# Patient Record
Sex: Female | Born: 1960 | Hispanic: Yes | Marital: Married | Smoking: Never smoker
Health system: Southern US, Community
[De-identification: ages and names within clinical notes are randomized; demographics above are authoritative.]

## PROBLEM LIST (undated history)

## (undated) DIAGNOSIS — G4730 Sleep apnea, unspecified: Secondary | ICD-10-CM

## (undated) DIAGNOSIS — N730 Acute parametritis and pelvic cellulitis: Secondary | ICD-10-CM

## (undated) DIAGNOSIS — F419 Anxiety disorder, unspecified: Secondary | ICD-10-CM

## (undated) DIAGNOSIS — I4891 Unspecified atrial fibrillation: Secondary | ICD-10-CM

## (undated) DIAGNOSIS — I10 Essential (primary) hypertension: Secondary | ICD-10-CM

## (undated) DIAGNOSIS — M1990 Unspecified osteoarthritis, unspecified site: Secondary | ICD-10-CM

## (undated) DIAGNOSIS — M797 Fibromyalgia: Secondary | ICD-10-CM

## (undated) DIAGNOSIS — E119 Type 2 diabetes mellitus without complications: Secondary | ICD-10-CM

## (undated) DIAGNOSIS — J45909 Unspecified asthma, uncomplicated: Secondary | ICD-10-CM

## (undated) DIAGNOSIS — F99 Mental disorder, not otherwise specified: Secondary | ICD-10-CM

## (undated) DIAGNOSIS — F32A Depression, unspecified: Secondary | ICD-10-CM

## (undated) DIAGNOSIS — E079 Disorder of thyroid, unspecified: Secondary | ICD-10-CM

## (undated) HISTORY — DX: Mental disorder, not otherwise specified: F99

## (undated) HISTORY — DX: Unspecified osteoarthritis, unspecified site: M19.90

## (undated) HISTORY — DX: Fibromyalgia: M79.7

## (undated) HISTORY — DX: Unspecified atrial fibrillation: I48.91

## (undated) HISTORY — DX: Disorder of thyroid, unspecified: E07.9

## (undated) HISTORY — PX: APPENDECTOMY: SHX54

## (undated) HISTORY — PX: THYROID LOBECTOMY: SHX420

## (undated) HISTORY — PX: TUBAL LIGATION: SHX77

## (undated) HISTORY — DX: Acute parametritis and pelvic cellulitis: N73.0

---

## 2011-07-31 DIAGNOSIS — G4733 Obstructive sleep apnea (adult) (pediatric): Secondary | ICD-10-CM | POA: Diagnosis present

## 2012-02-07 DIAGNOSIS — M797 Fibromyalgia: Secondary | ICD-10-CM | POA: Diagnosis present

## 2015-12-19 DIAGNOSIS — F32A Depression, unspecified: Secondary | ICD-10-CM | POA: Diagnosis present

## 2016-11-15 DIAGNOSIS — E890 Postprocedural hypothyroidism: Secondary | ICD-10-CM | POA: Diagnosis present

## 2020-03-21 DIAGNOSIS — J4520 Mild intermittent asthma, uncomplicated: Secondary | ICD-10-CM | POA: Insufficient documentation

## 2020-03-21 DIAGNOSIS — I10 Essential (primary) hypertension: Secondary | ICD-10-CM | POA: Diagnosis present

## 2020-04-01 ENCOUNTER — Emergency Department (HOSPITAL_COMMUNITY)
Admission: EM | Admit: 2020-04-01 | Discharge: 2020-04-01 | Disposition: A | Discharge status: Home / Self Care | Payer: Medicare HMO | Attending: Emergency Medicine | Admitting: Emergency Medicine

## 2020-04-01 ENCOUNTER — Other Ambulatory Visit: Payer: Self-pay

## 2020-04-01 DIAGNOSIS — Z5321 Procedure and treatment not carried out due to patient leaving prior to being seen by health care provider: Secondary | ICD-10-CM | POA: Insufficient documentation

## 2020-04-01 DIAGNOSIS — M62830 Muscle spasm of back: Secondary | ICD-10-CM | POA: Diagnosis not present

## 2020-04-01 DIAGNOSIS — M545 Low back pain, unspecified: Secondary | ICD-10-CM | POA: Diagnosis not present

## 2020-04-01 NOTE — ED Triage Notes (Signed)
Pt with hx of back spasms here for same, lasting since yesterday afternoon. Pt ambulatory but in obvious pain.

## 2020-04-01 NOTE — ED Notes (Signed)
Pt stated that she will not be able to wait any longer and will be leaving. Pt encouraged to stay, pt left anyways.

## 2020-06-06 ENCOUNTER — Other Ambulatory Visit: Payer: Self-pay

## 2020-06-06 ENCOUNTER — Ambulatory Visit (INDEPENDENT_AMBULATORY_CARE_PROVIDER_SITE_OTHER): Payer: Medicare HMO | Admitting: Obstetrics and Gynecology

## 2020-06-06 ENCOUNTER — Encounter: Payer: Self-pay | Admitting: Obstetrics and Gynecology

## 2020-06-06 VITALS — BP 145/83 | HR 74 | Ht 63.0 in | Wt 239.0 lb

## 2020-06-06 DIAGNOSIS — Z01419 Encounter for gynecological examination (general) (routine) without abnormal findings: Secondary | ICD-10-CM | POA: Diagnosis not present

## 2020-06-06 DIAGNOSIS — L988 Other specified disorders of the skin and subcutaneous tissue: Secondary | ICD-10-CM

## 2020-06-06 DIAGNOSIS — N649 Disorder of breast, unspecified: Secondary | ICD-10-CM | POA: Diagnosis not present

## 2020-06-06 NOTE — Progress Notes (Signed)
Obstetrics and Gynecology New Patient Evaluation  Appointment Date: 06/06/2020  OBGYN Clinic: [facility]  Primary Care Provider: Dr. Su Hilt  Referring Provider: self  Chief Complaint:  Chief Complaint  Patient presents with  . Gynecologic Exam    History of Present Illness: Allison Potts is a 60 y.o. [id number] (LMP: 60 y/o), seen for the above chief complaint.  Patient made annual exam for a regular GYN appointment. She was told by her PCP that she had "pus in her urine" and that she needed to see GYN. She states they wanted to treat her with abx but she didn't think it was necessary because she's had bacteria in her urine in the past and she is currently asymptomatic.    Review of Systems: Pertinent items noted in HPI and remainder of comprehensive ROS otherwise negative.   Past Medical History:  Past Medical History:  Diagnosis Date  . Arthritis   . Atrial fibrillation (HCC)   . Fibromyalgia   . Mental disorder   . PID (acute pelvic inflammatory disease)    her 55s  . 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 APPENDECTOMY    . THYROID LOBECTOMY    . TUBAL LIGATION      Past Obstetrical History:  OB History  Gravida Para Term Preterm AB Living  6 4 4   2 4   SAB IAB Ectopic Multiple Live Births  1 1     4     # Outcome Date GA Lbr Len/2nd Weight Sex Delivery Anes PTL Lv  6 Term           5 Term           4 Term           3 Term           2 IAB           1 SAB             Obstetric Comments  Vaginal delivery x 4    Past Gynecological History: As per HPI. History of Pap Smear(s): Yes.   Last pap 2020, which was negative History of HRT use: No.  Social History:  Social History   Socioeconomic History  . Marital status: Single    Spouse name: Not on file  . Number of children: Not on file  . Years of education: Not on file  .  Highest education level: Not on file  Occupational History  . Not on file  Tobacco Use  . Smoking status: Never Smoker  . Smokeless tobacco: Never Used  Vaping Use  . Vaping Use: Never used  Substance and Sexual Activity  . Alcohol use: Not Currently  . Drug use: Not Currently  . Sexual activity: Yes    Birth control/protection: Surgical  Other Topics Concern  . Not on file  Social History Narrative  . Not on file   Social Determinants of Health   Financial Resource Strain: Not on file  Food Insecurity: Not on file  Transportation Needs: Not on file  Physical Activity: Not on file  Stress: Not on file  Social Connections: Not on file  Intimate Partner Violence: Not on file    Family History:  Niece on her mom's side dx with breast cancer in her early 50s  Health Maintenance:  Mammogram(s): Yes.   Date: approx 10 years  ago  Medications None  Allergies Penicillins   Physical Exam:  BP (!) 145/83   Pulse 74   Ht 5\' 3"  (1.6 m)   Wt 239 lb (108.4 kg)   BMI 42.34 kg/m  Body mass index is 42.34 kg/m. General appearance: Well nourished, well developed female in no acute distress.  Neck:  Supple, normal appearance, and no thyromegaly  Cardiovascular: normal s1 and s2.  No murmurs, rubs or gallops. Respiratory:  Clear to auscultation bilateral. Normal respiratory effort Abdomen: positive bowel sounds and no masses, hernias; diffusely non tender to palpation, non distended Breasts: breasts appear normal, no suspicious masses, no skin or nipple changes or axillary nodes, except for a half cm spot at the 8 o'clock position on the areola, slightly raised, nttp, brown with slightly irregular borders and normal palpation. Pt states that with palpation her breasts are diffusely ttp Neuro/Psych:  Normal mood and affect.  Skin:  Warm and dry.  Lymphatic:  No inguinal lymphadenopathy.   Pelvic exam: is not limited by body habitus EGBUS: within normal limits, moderate  atrophy Vagina: within normal limits and with no blood or discharge in the vault, moderate atrophy Cervix: normal appearing cervix without tenderness, discharge or lesions. Uterus:  nonenlarged and non tender Adnexa:  normal adnexa and no mass, fullness, tenderness Rectovaginal: deferred  Laboratory: none  Radiology: none  Assessment: pt stable  Plan: 1. Well woman exam with routine gynecological exam Routine care. Patient declines mammogram. Prior GYN records from [place] reviewed and nothing remarkable. I told her that I don't have the results from the urine culture but usually asymptomatic bacteruria is not treated but to follow up with her PCP - Ambulatory referral to Dermatology  2. Skin lesion of breast Pt amenable to derm referral  - Ambulatory referral to Dermatology  Orders Placed This Encounter  Procedures  . Ambulatory referral to Dermatology    RTC PRN  , McLeod Bing MD Attending [facility])

## 2020-06-06 NOTE — Progress Notes (Signed)
Last pap 2020-WNL  Declines mammo  referred by PCP for bacteria in urine

## 2020-09-19 ENCOUNTER — Encounter: Payer: Self-pay | Admitting: Plastic Surgery

## 2020-09-19 ENCOUNTER — Ambulatory Visit (INDEPENDENT_AMBULATORY_CARE_PROVIDER_SITE_OTHER): Payer: Medicare HMO | Admitting: Plastic Surgery

## 2020-09-19 ENCOUNTER — Other Ambulatory Visit: Payer: Self-pay

## 2020-09-19 VITALS — BP 154/88 | HR 67 | Ht 63.0 in | Wt 229.0 lb

## 2020-09-19 DIAGNOSIS — M793 Panniculitis, unspecified: Secondary | ICD-10-CM

## 2020-09-19 NOTE — Progress Notes (Signed)
Referring Provider No referring provider defined for this encounter.   CC:  Chief Complaint  Patient presents with  . Advice Only      Allison Potts is an 60 y.o. female.  HPI: Patient presents to discuss her abdomen.  She is bothered by the overhanging skin.  She did lose about 30 pounds through diet and exercise and her weight has been stable for several years.  She gets rashes beneath the skin that been refractory to over-the-counter and prescription treatments.  These were treated at her previous doctors up in [place] and did not seem to help much.  Its worse with worse in the summertime.  Prior abdominal surgeries include appendectomy and tubal ligation.  She's pre-diabetic and does not smoke.  Allergies  Allergen Reactions  . Penicillins Hives    Passing out, lumps and bumps, was given epi    Outpatient Encounter Medications as of 09/19/2020  Medication Sig  . amphetamine-dextroamphetamine (ADDERALL) 10 MG tablet Take 10 mg by mouth 2 (two) times daily.  Marland Kitchen buPROPion (WELLBUTRIN SR) 150 MG 12 hr tablet Take 150 mg by mouth 2 (two) times daily.  Marland Kitchen gabapentin (NEURONTIN) 100 MG capsule Take 1 capsule by mouth 2 (two) times daily.  Marland Kitchen losartan (COZAAR) 50 MG tablet Take 1 tablet by mouth daily.  Marland Kitchen OVER THE COUNTER MEDICATION Vitamin C-Take 1 tablet daily.  Marland Kitchen OVER THE COUNTER MEDICATION Collagen-Take 1 daily.  Marland Kitchen OVER THE COUNTER MEDICATION MVI-Take 1 by mouth daily.   No facility-administered encounter medications on file as of 09/19/2020.     Past Medical History:  Diagnosis Date  . Arthritis   . Atrial fibrillation (HCC)   . Fibromyalgia   . Mental disorder   . PID (acute pelvic inflammatory disease)    her 5s  . 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 APPENDECTOMY    . THYROID LOBECTOMY    . TUBAL LIGATION      No family history on file.  Social History   Social History Narrative   . Not on file     Review of Systems General: Denies fevers, chills, weight loss CV: Denies chest pain, shortness of breath, palpitations  Physical Exam Vitals with BMI 09/19/2020 06/06/2020 04/01/2020  Height 5\' 3"  5\' 3"  -  Weight 229 lbs 239 lbs -  BMI 40.58 42.35 -  Systolic 154 145  Diastolic 88 83 72  Pulse 67 74 72    General:  No acute distress,  Alert and oriented, Non-Toxic, Normal speech and affect Abdomen: Abdomen is soft and nontender.  I do not appreciate any hernias.  She has an overhanging infraumbilical pannus with clear signs of skin irritation in the crease.  She has a excessive suprapubic area which bothers her but she pointed out to me.  No obvious scars in the upper abdomen  Assessment/Plan Patient presents with panniculitis.  We discussed infraumbilical panniculectomy with the possible addition of liposuction and plication if she were to want those additional procedures.  I discussed the infraumbilical panniculectomy would not address the upper abdomen and would partially address the suprapubic area with some debulking.  We discussed the risk that include bleeding, infection, damage to surrounding structures and need for additional procedures.  I discussed the expected postoperative course along with other risks that include potential persistent contour irregularities.  I discussed the location and orientation of the scar.  I discussed  the need for drains postoperatively.  We will plan to move forward with insurance approval and all of her questions were answered.  Allison Potts 09/19/2020, 11:46 AM

## 2020-10-04 ENCOUNTER — Telehealth: Payer: Self-pay | Admitting: Plastic Surgery

## 2020-10-04 NOTE — Telephone Encounter (Signed)
Returned patients call regarding cardia clearance. Patient was confused as to why she needed to see a cardiologist. I explained that with her history of A-fib, we prefer to have cardiac clearance for all elective procedures. She advised that her PCP could do an EKG for her and wanted to know if that was good enough. I advised that we cannot force her to see a cardiologist, but for cosmetic/elective surgeries, it is advantageous to be prepared for the facility/anesthesiologist to request cardiac clearance. Otherwise, we may run the risk of the surgery being postponed until cardiac clearance is obtained. Patient voiced understanding and agreement, and plans to see the cardiologist this week. She will see her PCP next week and we will await the surgery clearance from both doctors.

## 2020-10-05 ENCOUNTER — Encounter: Payer: Self-pay | Admitting: Cardiology

## 2020-10-05 ENCOUNTER — Ambulatory Visit: Payer: Medicare HMO | Admitting: Cardiology

## 2020-10-05 ENCOUNTER — Other Ambulatory Visit: Payer: Self-pay

## 2020-10-05 VITALS — BP 134/72 | HR 74 | Ht 63.0 in | Wt 228.6 lb

## 2020-10-05 DIAGNOSIS — I4891 Unspecified atrial fibrillation: Secondary | ICD-10-CM | POA: Diagnosis not present

## 2020-10-05 DIAGNOSIS — G4733 Obstructive sleep apnea (adult) (pediatric): Secondary | ICD-10-CM

## 2020-10-05 DIAGNOSIS — I10 Essential (primary) hypertension: Secondary | ICD-10-CM | POA: Diagnosis not present

## 2020-10-05 DIAGNOSIS — Z01810 Encounter for preprocedural cardiovascular examination: Secondary | ICD-10-CM | POA: Diagnosis not present

## 2020-10-05 DIAGNOSIS — Z01818 Encounter for other preprocedural examination: Secondary | ICD-10-CM

## 2020-10-05 NOTE — Patient Instructions (Addendum)
Medication Instructions:  Your physician recommends that you continue on your current medications as directed. Please refer to the Current Medication list given to you today.  Testing/Procedures: Your physician has requested that you have an echocardiogram (prior to 6/28 if possible). Echocardiography is a painless test that uses sound waves to create images of your heart. It provides your doctor with information about the size and shape of your heart and how well your heart's chambers and valves are working. This procedure takes approximately one hour. There are no restrictions for this procedure.  This will be done at our [facility] location:  [address]  Follow-Up: At [facility], you and your health needs are our priority.  As part of our continuing mission to provide you with exceptional heart care, we have created designated Provider Care Teams.  These Care Teams include your primary Cardiologist (physician) and Advanced Practice Providers (APPs -  Physician Assistants and Nurse Practitioners) who all work together to provide you with the care you need, when you need it.  We recommend signing up for the patient portal called "MyChart".  Sign up information is provided on this After Visit Summary.  MyChart is used to connect with patients for Virtual Visits (Telemedicine).  Patients are able to view lab/test results, encounter notes, upcoming appointments, etc.  Non-urgent messages can be sent to your provider as well.   To learn more about what you can do with MyChart, go to [email or web address].    Your next appointment:   3 month(s)  The format for your next appointment:   In Person  Provider:   Epifanio Lesches, MD

## 2020-10-05 NOTE — Progress Notes (Signed)
Cardiology Office Note:    Date:  10/05/2020   ID:  Allison Potts, DOB [birth year], MRN [id number]  PCP:  Burton Apley, MD  Cardiologist:  None  Electrophysiologist:  None   Referring MD: [name], MD   Chief Complaint  Patient presents with  . Atrial Fibrillation         History of Present Illness:    Allison Potts is a 60 y.o. female with a hx of atrial fibrillation who is referred for preop evaluation prior to panniculectomy by Dr Arita Miss.  Reports was diagnosed with atrial fibrillation in June 2021 in [place].  Reports occurred after having intercourse with her husband, felt palpitations and called EMS.  Was told she was in Afib.  Went to ED and converted to sinus rhythm.  She saw Dr. Ronny Flurry in [place].  She was not started on anticoagulation.  She does not think she had an echocardiogram done.  She purchased a Kardia mobile device and has been following for recurrent atrial fibrillation, has not had any.  Has not had any palpitations since that time.  She has been exercising regularly over the last 2 weeks.  She does 5-minutes of walking and swims continuously for 10 to 15 minutes and does jumping jacks for a total workout time of 30 minutes.  She has lost 12 pounds in the last few months.  She denies any exertional chest pain or dyspnea.  Denies any lightheadedness or syncope.  Does report some mild lower extremity edema.  Reports she smoked from age 44 to 76, up to 3 packs/day.  Family history includes father had CABG, she thinks in his 27s.   Past Medical History:  Diagnosis Date  . Arthritis   . Atrial fibrillation (HCC)   . Fibromyalgia   . Mental disorder   . PID (acute pelvic inflammatory disease)    her 71s  . 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 APPENDECTOMY    . THYROID LOBECTOMY    . TUBAL LIGATION      Current Medications: Current Meds  Medication Sig  .  amitriptyline (ELAVIL) 25 MG tablet amitriptyline 25 mg tablet  TK 1 T PO HS  . amphetamine-dextroamphetamine (ADDERALL) 10 MG tablet Take 10 mg by mouth 2 (two) times daily.  Marland Kitchen buPROPion (WELLBUTRIN SR) 150 MG 12 hr tablet Take 150 mg by mouth 2 (two) times daily.  Marland Kitchen gabapentin (NEURONTIN) 100 MG capsule Take 1 capsule by mouth 2 (two) times daily.  . hydrOXYzine (ATARAX/VISTARIL) 50 MG tablet hydroxyzine HCl 50 mg tablet  . losartan (COZAAR) 50 MG tablet Take 1 tablet by mouth daily.  Marland Kitchen OVER THE COUNTER MEDICATION Vitamin C-Take 1 tablet daily.  Marland Kitchen OVER THE COUNTER MEDICATION Collagen-Take 1 daily.  Marland Kitchen OVER THE COUNTER MEDICATION MVI-Take 1 by mouth daily.     Allergies:   Penicillins   Social History   Socioeconomic History  . Marital status: Married    Spouse name: Not on file  . Number of children: Not on file  . Years of education: Not on file  . Highest education level: Not on file  Occupational History  . Not on file  Tobacco Use  . Smoking status: Never Smoker  . Smokeless tobacco: Never Used  Vaping Use  . Vaping Use: Never used  Substance and Sexual Activity  . Alcohol use: Not Currently  . Drug use: Not Currently  .  Sexual activity: Yes    Birth control/protection: Surgical  Other Topics Concern  . Not on file  Social History Narrative  . Not on file   Social Determinants of Health   Financial Resource Strain: Not on file  Food Insecurity: Not on file  Transportation Needs: Not on file  Physical Activity: Not on file  Stress: Not on file  Social Connections: Not on file     Family History: Family history includes father had CABG, she thinks in his 11s.  ROS:   Please see the history of present illness.    All other systems reviewed and are negative.  EKGs/Labs/Other Studies Reviewed:    The following studies were reviewed today:   EKG:  EKG is  ordered today.  The ekg ordered today demonstrates normal sinus rhythm, rate 74, poor R wave  progression  Recent Labs: No results found for requested labs within last 8760 hours.  Recent Lipid Panel No results found for: CHOL, TRIG, HDL, CHOLHDL, VLDL, LDLCALC, LDLDIRECT  Physical Exam:    VS:  BP 134/72   Pulse 74   Ht 5\' 3"  (1.6 m)   Wt 228 lb 9.6 oz (103.7 kg)   SpO2 95%   BMI 40.49 kg/m     Wt Readings from Last 3 Encounters:  10/05/20 228 lb 9.6 oz (103.7 kg)  09/19/20 229 lb (103.9 kg)  06/06/20 239 lb (108.4 kg)     GEN: Well nourished, well developed in no acute distress HEENT: Normal NECK: No JVD; No carotid bruits LYMPHATICS: No lymphadenopathy CARDIAC: RRR, no murmurs, rubs, gallops RESPIRATORY:  Clear to auscultation without rales, wheezing or rhonchi  ABDOMEN: Soft, non-tender, non-distended MUSCULOSKELETAL:  No edema; No deformity  SKIN: Warm and dry NEUROLOGIC:  Alert and oriented x 3 PSYCHIATRIC:  Normal affect   ASSESSMENT:    1. Atrial fibrillation, unspecified type (HCC)   2. Pre-op evaluation   3. Essential hypertension   4. OSA (obstructive sleep apnea)    PLAN:    Atrial fibrillation: Patient reports had a single episode of A. fib in June 2021, went to ED in 08-12-2000 and converted to sinus rhythm spontaneously.  CHA2DS2-VASc 2 (hypertension, female), she was not started on anticoagulation -We will obtain records from prior cardiologist, Dr. [place] in [facility] -Echocardiogram -Continue to monitor for recurrent A. fib on Kardia mobile device  Preop evaluation: Prior to panniculectomy.  No symptoms to suggest active cardiac condition.  Good functional capacity, greater than 4 METS without any exertional symptoms.  Planning echocardiogram as above due to history of A. fib, otherwise no cardiac work-up recommended prior to surgery.  Hypertension: on losartan 50 mg daily.  Appears controlled  OSA: has CPAP but has not been using since she moved to [facility]. Will refer to sleep medicine.  RTC in 3  months   Medication Adjustments/Labs and Tests Ordered: Current medicines are reviewed at length with the patient today.  Concerns regarding medicines are outlined above.  Orders Placed This Encounter  Procedures  . EKG 12-Lead  . ECHOCARDIOGRAM COMPLETE   No orders of the defined types were placed in this encounter.   Patient Instructions  Medication Instructions:  Your physician recommends that you continue on your current medications as directed. Please refer to the Current Medication list given to you today.  Testing/Procedures: Your physician has requested that you have an echocardiogram (prior to 6/28 if possible). Echocardiography is a painless test that uses sound waves to create images of your  heart. It provides your doctor with information about the size and shape of your heart and how well your heart's chambers and valves are working. This procedure takes approximately one hour. There are no restrictions for this procedure.  This will be done at our [facility] location:  [address]  Follow-Up: At [facility], you and your health needs are our priority.  As part of our continuing mission to provide you with exceptional heart care, we have created designated Provider Care Teams.  These Care Teams include your primary Cardiologist (physician) and Advanced Practice Providers (APPs -  Physician Assistants and Nurse Practitioners) who all work together to provide you with the care you need, when you need it.  We recommend signing up for the patient portal called "MyChart".  Sign up information is provided on this After Visit Summary.  MyChart is used to connect with patients for Virtual Visits (Telemedicine).  Patients are able to view lab/test results, encounter notes, upcoming appointments, etc.  Non-urgent messages can be sent to your provider as well.   To learn more about what you can do with MyChart, go to [email or web address].    Your next  appointment:   3 month(s)  The format for your next appointment:   In Person  Provider:   Epifanio Lesches, MD        Signed, Allison Ishikawa, MD  10/05/2020 3:08 PM    [facility]

## 2020-10-06 ENCOUNTER — Institutional Professional Consult (permissible substitution): Payer: Medicare HMO | Admitting: Plastic Surgery

## 2020-10-13 ENCOUNTER — Ambulatory Visit: Payer: Medicare HMO | Admitting: Plastic Surgery

## 2020-10-13 ENCOUNTER — Other Ambulatory Visit: Payer: Self-pay

## 2020-10-13 ENCOUNTER — Encounter: Payer: Self-pay | Admitting: Plastic Surgery

## 2020-10-13 VITALS — BP 156/84 | HR 83 | Ht 63.0 in | Wt 226.8 lb

## 2020-10-13 DIAGNOSIS — Z411 Encounter for cosmetic surgery: Secondary | ICD-10-CM

## 2020-10-13 DIAGNOSIS — Z719 Counseling, unspecified: Secondary | ICD-10-CM

## 2020-10-13 DIAGNOSIS — M793 Panniculitis, unspecified: Secondary | ICD-10-CM

## 2020-10-13 MED ORDER — ONDANSETRON HCL 4 MG PO TABS: 4.0000 mg | ORAL_TABLET | Freq: Three times a day (TID) | ORAL | 0 refills | Status: DC | PRN

## 2020-10-13 MED ORDER — HYDROCODONE-ACETAMINOPHEN 5-325 MG PO TABS: 1.0000 | ORAL_TABLET | ORAL | 0 refills | Status: DC | PRN

## 2020-10-13 NOTE — Progress Notes (Signed)
   Referring Provider Burton Apley, MD [address], [address] [place],  [place] [address]   CC:  Chief Complaint  Patient presents with   Pre-op Exam      Allison Potts is an 60 y.o. female.  HPI: Patient presents for preoperative appointment for her upcoming infraumbilical panniculectomy combined with upper abdominal liposuction and plication.  She has a few questions and concerns to go over.  Allergies  Allergen Reactions   Penicillins Hives    Passing out, lumps and bumps, was given epi    Outpatient Encounter Medications as of 10/13/2020  Medication Sig   amphetamine-dextroamphetamine (ADDERALL) 10 MG tablet Take 10 mg by mouth 2 (two) times daily.   buPROPion (WELLBUTRIN SR) 150 MG 12 hr tablet Take 150 mg by mouth 2 (two) times daily.   gabapentin (NEURONTIN) 100 MG capsule Take 1 capsule by mouth 2 (two) times daily.   losartan (COZAAR) 50 MG tablet Take 1 tablet by mouth daily.   OVER THE COUNTER MEDICATION Vitamin C-Take 1 tablet daily.   OVER THE COUNTER MEDICATION Collagen-Take 1 daily.   OVER THE COUNTER MEDICATION MVI-Take 1 by mouth daily.   [DISCONTINUED] amitriptyline (ELAVIL) 25 MG tablet amitriptyline 25 mg tablet  TK 1 T PO HS (Patient not taking: Reported on 10/13/2020)   [DISCONTINUED] hydrOXYzine (ATARAX/VISTARIL) 50 MG tablet hydroxyzine HCl 50 mg tablet (Patient not taking: Reported on 10/13/2020)   No facility-administered encounter medications on file as of 10/13/2020.     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  Social History   Social History Narrative   Not on file     Review of Systems General: Denies fevers,  chills, weight loss CV: Denies chest pain, shortness of breath, palpitations  Physical Exam Vitals with BMI 10/13/2020 10/05/2020 09/19/2020  Height 5\' 3"  5\' 3"  5\' 3"   Weight 226 lbs 13 oz 228 lbs 10 oz 229 lbs  BMI 40.19 40.5 40.58  Systolic 156 134  Diastolic 84 72 88  Pulse 83 74 67    General:  No acute distress,  Alert and oriented, Non-Toxic, Normal speech and affect Cardiovascular: Regular rhythm Pulmonary: Unlabored   Assessment/Plan Patient presents for preoperative appointment for infraumbilical panniculectomy combined with upper abdominal liposuction and plication.  We went over the risks of the procedure in detail that include bleeding, infection, damage to surrounding structures and need for additional procedure.  We discussed the details of the operation along with the location and orientation of the scars.  We discussed other postoperative complications including wound healing and persistent contour irregularities.  She went over our consent form in detail and signed it today.  She had an opportunity to ask any additional questions about the consent form and all of her questions were ultimately answered.  I will plan to send her in Norco and Zofran for her postoperative prescriptions and we will see her the day of her surgery.  10/13/2020, 5:01 PM

## 2020-10-19 ENCOUNTER — Other Ambulatory Visit: Payer: Self-pay

## 2020-10-19 ENCOUNTER — Telehealth: Payer: Self-pay

## 2020-10-19 ENCOUNTER — Ambulatory Visit (HOSPITAL_COMMUNITY)
Admission: RE | Admit: 2020-10-19 | Discharge: 2020-10-19 | Disposition: A | Discharge status: Home / Self Care | Payer: Medicare HMO | Source: Ambulatory Visit | Attending: Cardiology | Admitting: Cardiology

## 2020-10-19 DIAGNOSIS — I4891 Unspecified atrial fibrillation: Secondary | ICD-10-CM | POA: Diagnosis present

## 2020-10-19 DIAGNOSIS — I517 Cardiomegaly: Secondary | ICD-10-CM | POA: Diagnosis not present

## 2020-10-19 LAB — ECHOCARDIOGRAM COMPLETE
Area-P 1/2: 2.91 cm2
S' Lateral: 2.8 cm

## 2020-10-19 NOTE — Telephone Encounter (Signed)
Patient states she hurt her back after overdoing it swimming. She was prescribed Prednisone but has not taken any as she is managing the pain with Motrin, 400 mg 2-3 times daily. She would like to know if it is ok to continue taking Motrin this soon before surgery.

## 2020-10-19 NOTE — Progress Notes (Signed)
  Echocardiogram 2D Echocardiogram has been performed.  Allison Potts 10/19/2020, 9:12 AM

## 2020-10-19 NOTE — Telephone Encounter (Signed)
Returned patients call. Spoke with Dr. Arita Miss and he is okay to continue taking the motrin up until a couple of days prior to surgery. Patient understood and agreed with plan.

## 2020-10-20 ENCOUNTER — Encounter: Payer: Self-pay | Admitting: Plastic Surgery

## 2020-10-20 ENCOUNTER — Telehealth: Payer: Self-pay | Admitting: Plastic Surgery

## 2020-10-20 NOTE — Telephone Encounter (Signed)
Returned patients call. Advised her if the RN from [facility] approved taking the prednisone prior to surgery, she can take the medication. Patient understood and agreed with plan.

## 2020-10-20 NOTE — Telephone Encounter (Signed)
Patient stated she is in a lot of pain and wants to know if she can go ahead and take methylprednisolone 4mg  prior to her surgery. Per patient, RN at sugery site approved it. Please call to advise [phone]. Thanks.

## 2020-10-23 ENCOUNTER — Ambulatory Visit (HOSPITAL_COMMUNITY)
Admission: EM | Admit: 2020-10-23 | Discharge: 2020-10-23 | Disposition: A | Discharge status: Home / Self Care | Payer: Medicare HMO

## 2020-10-23 ENCOUNTER — Other Ambulatory Visit: Payer: Self-pay

## 2020-10-23 ENCOUNTER — Encounter (HOSPITAL_COMMUNITY): Payer: Self-pay

## 2020-10-23 DIAGNOSIS — M545 Low back pain, unspecified: Secondary | ICD-10-CM

## 2020-10-23 MED ORDER — BACITRACIN ZINC 500 UNIT/GM EX OINT
TOPICAL_OINTMENT | CUTANEOUS | Status: AC
  Filled 2020-10-23: qty 0.9

## 2020-10-23 MED ORDER — KETOROLAC TROMETHAMINE 60 MG/2ML IM SOLN
60.0000 mg | Freq: Once | INTRAMUSCULAR | Status: AC
  Administered 2020-10-23: 60 mg via INTRAMUSCULAR

## 2020-10-23 MED ORDER — KETOROLAC TROMETHAMINE 60 MG/2ML IM SOLN
INTRAMUSCULAR | Status: AC
  Filled 2020-10-23: qty 2

## 2020-10-23 NOTE — ED Provider Notes (Addendum)
[facility]    CSN: [id number] Arrival date & time: 10/23/20  1017      History   Chief Complaint Chief Complaint  Patient presents with   Back Pain    HPI Allison Potts is a 60 y.o. female.   HPI  Back Pain: Patient reports that she has had bilateral lower back pain for the past 7 days.  Acute on chronic in nature secondary to fibromyalgia and scoliosis per patient. No known injury.  She denies any urinary symptoms or abdominal pain.  She states that she has tried Medrol without any improvement. She states that normally when this occurs she needs a shot of Toradol which helps. Complicating history is an upcoming elective surgery on 10/25/2020. No saddle anesthesia, incontinence or neuro weakness.  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   Past Surgical History:  Procedure Laterality Date   APPENDECTOMY     THYROID LOBECTOMY     TUBAL LIGATION      OB History     Gravida  6   Para  4   Term  4   Preterm      AB  2   Living  4      SAB  1   IAB  1   Ectopic      Multiple      Live Births  4        Obstetric Comments  Vaginal delivery x 4          Home Medications    Prior to Admission medications   Medication Sig Start Date End Date Taking? Authorizing Provider  amphetamine-dextroamphetamine (ADDERALL) 10 MG tablet Take 10 mg by mouth 2 (two) times daily. 08/16/20   [provider]  buPROPion (WELLBUTRIN SR) 150 MG 12 hr tablet Take 150 mg by mouth 2 (two) times daily. 07/07/20   [provider]  carisoprodol (SOMA) 250 MG tablet TAKE 1 TABLET TWICE A DAY AS NEEDED FOR PAIN 06/17/20   [provider]  gabapentin (NEURONTIN) 100 MG capsule Take 1 capsule by mouth 2  (two) times daily.    [provider]  HYDROcodone-acetaminophen (NORCO) 5-325 MG tablet Take 1 tablet by mouth every 4 (four) hours as needed for moderate pain. 10/13/20   [name], MD  losartan (COZAAR) 50 MG tablet Take 1 tablet by mouth daily. 07/12/20   [provider]  methylPREDNISolone (MEDROL) 4 MG tablet Take 4 mg by mouth 2 (two) times daily. 10/18/20   [provider]  ondansetron (ZOFRAN) 4 MG tablet Take 1 tablet (4 mg total) by mouth every 8 (eight) hours as needed for nausea or vomiting. 10/13/20   [name], MD  OVER THE COUNTER MEDICATION Vitamin C-Take 1 tablet daily.    [provider]  OVER THE COUNTER MEDICATION Collagen-Take 1 daily.    [provider]  OVER THE COUNTER MEDICATION MVI-Take 1 by mouth daily.    [provider]    Family History Family History  Problem Relation Age of Onset   Diabetes Mother    Heart disease Mother    Emphysema Mother    Heart disease Father  Diabetes Father     Social History Social History   Tobacco Use   Smoking status: Never   Smokeless tobacco: Never  Vaping Use   Vaping Use: Never used  Substance Use Topics   Alcohol use: Not Currently   Drug use: Not Currently     Allergies   Penicillins and Lamotrigine   Review of Systems Review of Systems  As stated above in HPI Physical Exam Triage Vital Signs ED Triage Vitals  Enc Vitals Group     BP 10/23/20 1101 (!) 149/86     Pulse Rate 10/23/20 1101 75     Resp 10/23/20 1101 20     Temp 10/23/20 1101 98.4 F (36.9 C)     Temp Source 10/23/20 1101 Oral     SpO2 10/23/20 1101 100 %     Weight --      Height --      Head Circumference --      Peak Flow --      Pain Score 10/23/20 1058 8     Pain Loc --      Pain Edu? --      Excl. in GC? --    No data found.  Updated Vital Signs BP (!) 149/86 (BP Location: Right Arm)   Pulse 75   Temp 98.4 F (36.9 C) (Oral)   Resp 20   SpO2 100%    Physical Exam Vitals and nursing note reviewed.  Constitutional:      General: She is in acute distress (tearful).     Appearance: Normal appearance. She is not ill-appearing, toxic-appearing or diaphoretic.  HENT:     Head: Normocephalic and atraumatic.  Musculoskeletal:        General: Tenderness present.  Skin:    General: Skin is warm.  Neurological:     General: No focal deficit present.     Mental Status: She is alert and oriented to person, place, and time.     Cranial Nerves: No cranial nerve deficit.     Motor: No weakness.     Coordination: Coordination normal.     Gait: Gait normal.     Deep Tendon Reflexes: Reflexes normal.     UC Treatments / Results  Labs (all labs ordered are listed, but only abnormal results are displayed) Labs Reviewed - No data to display  EKG   Radiology No results found.  Procedures Procedures (including critical care time)  Medications Ordered in UC Medications - No data to display  Initial Impression / Assessment and Plan / UC Course  I have reviewed the triage vital signs and the nursing notes.  Pertinent labs & imaging results that were available during my care of the patient were reviewed by me and considered in my medical decision making (see chart for details).     New.  Acute on chronic low back pain without sciatica.  Administering Toradol injection and patient will follow up with her surgeon and PCP to formulate continuation of plan. She declines muscle relaxers.  Discussed red flag signs and symptoms. Final Clinical Impressions(s) / UC Diagnoses   Final diagnoses:  None   Discharge Instructions   None    ED Prescriptions   None    PDMP not reviewed this encounter.   Rushie Chestnut, PA-C 10/23/20 1117    Rushie Chestnut, PA-C 10/23/20 1118

## 2020-10-23 NOTE — ED Triage Notes (Signed)
Pt reports lower back pain x 7 days. Denies dysuria, increased urinary frequency, .Reports chronic back pain. Methylprednisolone 4 mg gives no relief. States Toradol shot gives relief.   Reports she will have a tummy tuck on 10/25/2020.

## 2020-10-24 ENCOUNTER — Telehealth: Payer: Self-pay | Admitting: Plastic Surgery

## 2020-10-24 NOTE — Telephone Encounter (Signed)
Received incoming call from patient asking for a return call regarding surgery that is scheduled for tomorrow. Called patient back, her sister Sallye Ober?) is currently taking her to the ED due to extreme back pain that has not subsided from last week. I advised that I will cancel the surgery and corresponding appointments, and that the patient can call me back to reschedule when she is feeling better. Patient's sister will pass along the information. Dr. Arita Miss has been made aware of this information.

## 2020-10-25 ENCOUNTER — Emergency Department (HOSPITAL_COMMUNITY): Payer: Medicare HMO

## 2020-10-25 ENCOUNTER — Encounter (HOSPITAL_COMMUNITY): Payer: Self-pay | Admitting: Emergency Medicine

## 2020-10-25 ENCOUNTER — Emergency Department (HOSPITAL_COMMUNITY)
Admission: EM | Admit: 2020-10-25 | Discharge: 2020-10-25 | Disposition: A | Discharge status: Home / Self Care | Payer: Medicare HMO | Attending: Emergency Medicine | Admitting: Emergency Medicine

## 2020-10-25 DIAGNOSIS — R911 Solitary pulmonary nodule: Secondary | ICD-10-CM | POA: Insufficient documentation

## 2020-10-25 DIAGNOSIS — R109 Unspecified abdominal pain: Secondary | ICD-10-CM | POA: Diagnosis not present

## 2020-10-25 DIAGNOSIS — M545 Low back pain, unspecified: Secondary | ICD-10-CM | POA: Diagnosis present

## 2020-10-25 DIAGNOSIS — M549 Dorsalgia, unspecified: Secondary | ICD-10-CM

## 2020-10-25 LAB — CBC
HCT: 46.9 % — ABNORMAL HIGH (ref 36.0–46.0)
Hemoglobin: 15.2 g/dL — ABNORMAL HIGH (ref 12.0–15.0)
MCH: 29.7 pg (ref 26.0–34.0)
MCHC: 32.4 g/dL (ref 30.0–36.0)
MCV: 91.8 fL (ref 80.0–100.0)
Platelets: 376 10*3/uL (ref 150–400)
RBC: 5.11 MIL/uL (ref 3.87–5.11)
RDW: 13 % (ref 11.5–15.5)
WBC: 11.6 10*3/uL — ABNORMAL HIGH (ref 4.0–10.5)
nRBC: 0 % (ref 0.0–0.2)

## 2020-10-25 LAB — BASIC METABOLIC PANEL
Anion gap: 14 (ref 5–15)
BUN: 16 mg/dL (ref 8–23)
CO2: 23 mmol/L (ref 22–32)
Calcium: 9.2 mg/dL (ref 8.9–10.3)
Chloride: 100 mmol/L (ref 98–111)
Creatinine, Ser: 0.59 mg/dL (ref 0.44–1.00)
GFR, Estimated: >60 mL/min (ref >60)
Glucose, Bld: 83 mg/dL (ref 70–99)
Potassium: 4 mmol/L (ref 3.5–5.1)
Sodium: 137 mmol/L (ref 135–145)

## 2020-10-25 LAB — URINALYSIS, ROUTINE W REFLEX MICROSCOPIC
Bilirubin Urine: NEGATIVE
Glucose, UA: NEGATIVE mg/dL
Ketones, ur: NEGATIVE mg/dL
Nitrite: NEGATIVE
Protein, ur: NEGATIVE mg/dL
Specific Gravity, Urine: 1.011 (ref 1.005–1.030)
pH: 6 (ref 5.0–8.0)

## 2020-10-25 MED ORDER — OXYCODONE-ACETAMINOPHEN 5-325 MG PO TABS: 2.0000 | ORAL_TABLET | Freq: Four times a day (QID) | ORAL | 0 refills | Status: AC | PRN

## 2020-10-25 MED ORDER — HYDROMORPHONE HCL 1 MG/ML IJ SOLN
1.0000 mg | Freq: Once | INTRAMUSCULAR | Status: AC
  Administered 2020-10-25: 1 mg via INTRAVENOUS
  Filled 2020-10-25: qty 1

## 2020-10-25 MED ORDER — HYDROMORPHONE HCL 1 MG/ML IJ SOLN
1.0000 mg | Freq: Once | INTRAMUSCULAR | Status: AC
  Administered 2020-10-25: 1 mg via INTRAVENOUS
  Filled 2020-10-25 (×2): qty 1

## 2020-10-25 NOTE — ED Notes (Signed)
Pt up ambulating around the room without difficulty. Gait steady and even.

## 2020-10-25 NOTE — ED Provider Notes (Signed)
[facility] EMERGENCY DEPARTMENT Provider Note   CSN: [id number] Arrival date & time: 10/25/20  0944     History Chief complaint: Back pain  Allison Potts is a 60 y.o. female.  HPI  Patient presents to the ED with complaints of pain ongoing in her lower back now for over a week.  Patient denies any recent injuries.  She started having pain in her lower back.  Patient states she does have history of having trouble with pain in her back for a while and would intermittently get flares of this pain.  Patient states she was even admitted to the hospital when she lived in [place] for evaluation of her back pain.  Patient states she has never been diagnosed with a specific cause of her back pain.  Patient initially went to an urgent care and was given an injection of Toradol.  She states usually that would help her out.  However it has not helped with this episode.  Yesterday she also went to her primary care doctor and was started on Vicodin without any relief.  Patient states she continues to have severe pain in her back.  It increases with movement.  She denies any abdominal pain.  No dysuria.  No numbness or weakness.  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   Past Surgical History:  Procedure Laterality Date   APPENDECTOMY     THYROID LOBECTOMY     TUBAL LIGATION       OB History     Gravida  6   Para  4   Term  4   Preterm      AB  2   Living  4      SAB  1   IAB  1   Ectopic      Multiple      Live Births  4        Obstetric Comments  Vaginal delivery x 4         Family History  Problem Relation Age of Onset   Diabetes Mother    Heart disease Mother    Emphysema  Mother    Heart disease Father    Diabetes Father     Social History   Tobacco Use   Smoking status: Never   Smokeless tobacco: Never  Vaping Use   Vaping Use: Never used  Substance Use Topics   Alcohol use: Not Currently   Drug use: Not Currently    Home Medications Prior to Admission medications   Medication Sig Start Date End Date Taking? Authorizing Provider  oxyCODONE-acetaminophen (PERCOCET/ROXICET) 5-325 MG tablet Take 2 tablets by mouth every 6 (six) hours as needed for up to 3 days for severe pain. 10/25/20 10/28/20 Yes Linwood Dibbles, MD  amphetamine-dextroamphetamine (ADDERALL) 10 MG tablet Take 10 mg by mouth 2 (two) times daily. 08/16/20   [provider]  buPROPion (WELLBUTRIN SR) 150 MG 12 hr tablet Take 150 mg by mouth 2 (two) times daily. 07/07/20   [provider]  carisoprodol (SOMA) 250 MG tablet TAKE 1 TABLET TWICE A DAY AS NEEDED FOR PAIN 06/17/20   [provider]  fluticasone (FLONASE) 50 MCG/ACT nasal spray  10/18/20   [provider]  gabapentin (NEURONTIN) 100 MG capsule Take 1 capsule by mouth 2 (two) times daily.    [provider]  gabapentin (NEURONTIN) 300 MG capsule  10/18/20   [provider]  HYDROcodone-acetaminophen (NORCO) 5-325 MG tablet Take 1 tablet by mouth every 4 (four) hours as needed for moderate pain. 10/13/20   [name], MD  losartan (COZAAR) 50 MG tablet Take 1 tablet by mouth daily. 07/12/20   [provider]  methylPREDNISolone (MEDROL) 4 MG tablet Take 4 mg by mouth 2 (two) times daily. 10/18/20   [provider]  ondansetron (ZOFRAN) 4 MG tablet Take 1 tablet (4 mg total) by mouth every 8 (eight) hours as needed for nausea or vomiting. 10/13/20   [name], MD  OVER THE COUNTER MEDICATION Vitamin C-Take 1 tablet daily.    [provider]  OVER THE COUNTER MEDICATION Collagen-Take 1 daily.    [provider]  OVER THE COUNTER MEDICATION MVI-Take 1  by mouth daily.    [provider]  predniSONE (DELTASONE) 5 MG tablet  10/18/20   [provider]  tiZANidine (ZANAFLEX) 4 MG tablet  10/18/20   [provider]    Allergies    Penicillins and Lamotrigine  Review of Systems   Review of Systems  All other systems reviewed and are negative.  Physical Exam Updated Vital Signs BP (!) 183/83   Pulse 63   Temp 98.3 F (36.8 C) (Oral)   Resp 17   SpO2 100%   Physical Exam Vitals and nursing note reviewed.  Constitutional:      Appearance: She is well-developed.     Comments: Tearful, in pain  HENT:     Head: Normocephalic and atraumatic.     Right Ear: External ear normal.     Left Ear: External ear normal.  Eyes:     General: No scleral icterus.       Right eye: No discharge.        Left eye: No discharge.     Conjunctiva/sclera: Conjunctivae normal.  Neck:     Trachea: No tracheal deviation.  Cardiovascular:     Rate and Rhythm: Normal rate and regular rhythm.  Pulmonary:     Effort: Pulmonary effort is normal. No respiratory distress.     Breath sounds: Normal breath sounds. No stridor. No wheezing or rales.  Abdominal:     General: Bowel sounds are normal. There is no distension.     Palpations: Abdomen is soft.     Tenderness: There is no abdominal tenderness. There is no guarding or rebound.  Musculoskeletal:        General: No deformity.     Cervical back: Neck supple.     Lumbar back: Tenderness present. Decreased range of motion.     Comments: Pain with sitting up in bed  Skin:    General: Skin is warm and dry.     Findings: No rash.  Neurological:     General: No focal deficit present.     Mental Status: She is alert.     Cranial Nerves: No cranial nerve deficit (no facial droop, extraocular movements intact, no slurred speech).     Sensory: No sensory deficit.     Motor: No abnormal muscle tone or seizure activity.     Coordination: Coordination normal.     Comments: Normal  sensation and strength bilateral lower  extremities  Psychiatric:        Mood and Affect: Mood normal.    ED Results / Procedures / Treatments   Labs (all labs ordered are listed, but only abnormal results are displayed) Labs Reviewed  CBC - Abnormal; Notable for the following components:      Result Value   WBC 11.6 (*)    Hemoglobin 15.2 (*)    HCT 46.9 (*)    All other components within normal limits  URINALYSIS, ROUTINE W REFLEX MICROSCOPIC - Abnormal; Notable for the following components:   Hgb urine dipstick MODERATE (*)    Leukocytes,Ua SMALL (*)    Bacteria, UA RARE (*)    All other components within normal limits  BASIC METABOLIC PANEL    EKG None  Radiology DG Lumbar Spine Complete  Result Date: 10/25/2020 CLINICAL DATA:  Chronic back pain for several years EXAM: LUMBAR SPINE - COMPLETE 4+ VIEW COMPARISON:  None. FINDINGS: Five lumbar type vertebral bodies are well visualized. No pars defects are noted. No anterolisthesis is seen. Vertebral body height is well maintained. Disc space narrowing is noted L5-S1. No soft tissue abnormality is seen. IMPRESSION: Mild degenerative change without acute abnormality. Electronically Signed   By: Alcide CleverMark  Lukens M.D.   On: 10/25/2020 11:05   CT Renal Stone Study  Result Date: 10/25/2020 CLINICAL DATA:  Acute lower back pain. EXAM: CT ABDOMEN AND PELVIS WITHOUT CONTRAST TECHNIQUE: Multidetector CT imaging of the abdomen and pelvis was performed following the standard protocol without IV contrast. COMPARISON:  None. FINDINGS: Lower chest: 20 x 16 mm irregular density is noted in the right middle lobe concerning for possible malignancy. Hepatobiliary: No focal liver abnormality is seen. No gallstones, gallbladder wall thickening, or biliary dilatation. Pancreas: Unremarkable. No pancreatic ductal dilatation or surrounding inflammatory changes. Spleen: Normal in size without focal abnormality. Adrenals/Urinary Tract: Adrenal glands are  unremarkable. Kidneys are normal, without renal calculi, focal lesion, or hydronephrosis. Bladder is unremarkable. Stomach/Bowel: The stomach appears normal. There is no evidence of bowel obstruction or inflammation. Status post appendectomy. Stool is noted throughout the colon. Vascular/Lymphatic: Aortic atherosclerosis. No enlarged abdominal or pelvic lymph nodes. Reproductive: Uterus and bilateral adnexa are unremarkable. Other: No abdominal wall hernia or abnormality. No abdominopelvic ascites. Musculoskeletal: No acute or significant osseous findings. IMPRESSION: 20 x 16 mm irregular density is noted in right middle lobe concerning for possible primary malignancy. CT scan of the chest is recommended for further evaluation. No acute abnormality seen in the abdomen or pelvis. Aortic Atherosclerosis (ICD10-I70.0). Electronically Signed   By: Lupita RaiderJames  Green Jr M.D.   On: 10/25/2020 15:13    Procedures Procedures   Medications Ordered in ED Medications  HYDROmorphone (DILAUDID) injection 1 mg (1 mg Intravenous Given 10/25/20 1114)  HYDROmorphone (DILAUDID) injection 1 mg (1 mg Intravenous Given 10/25/20 1310)    ED Course  I have reviewed the triage vital signs and the nursing notes.  Pertinent labs & imaging results that were available during my care of the patient were reviewed by me and considered in my medical decision making (see chart for details).  Clinical Course as of 10/25/20 1540  Tue Oct 25, 2020  1358 DG Lumbar Spine Complete [JK]  1358 X-ray does not show any acute findings.  CBC and metabolic panel are normal [JK]  1427 Urinalysis does show moderate hemoglobin.  Will CT to evaluate for ureteral stone [JK]    Clinical Course User Index [JK] Linwood DibblesKnapp, Vinicius Brockman, MD   MDM Rules/Calculators/A&P  Patient presented to the ED with complaints of acute low back pain.  Patient denies any focal numbness or weakness.  Patient did have tenderness in the lumbar spine region.   No swelling or findings to suggest infection.  CBC is normal.  Imaging tests were performed and there is no evidence of any lytic lesions noted on her plain films or CT scan.  Patient does not have any acute neurologic dysfunction.  Do not feel that emergent MRI is indicated.  Patient did have a pulmonary nodule noted on CT scan.  Concerning for the possibility of malignancy.  I did discuss this finding with the patient and recommended outpatient CT scan with her primary care doctor. Final Clinical Impression(s) / ED Diagnoses Final diagnoses:  Acute back pain, unspecified back location, unspecified back pain laterality  Pulmonary nodule    Rx / DC Orders ED Discharge Orders          Ordered    oxyCODONE-acetaminophen (PERCOCET/ROXICET) 5-325 MG tablet  Every 6 hours PRN        10/25/20 1539             Linwood Dibbles, MD 10/25/20 1541

## 2020-10-25 NOTE — ED Triage Notes (Addendum)
C/o left lower back pain x 8 days.  Denies known injury.  States she was seen at [facility] on Sunday and given Toradol without relief.  Seen by PCP yesterday and taking Vicodin without relief.  Denies urinary symptoms.  Hx of chronic back pain.  Hypertensive.

## 2020-10-25 NOTE — Discharge Instructions (Addendum)
Take medication as needed for pain.  You can also try over-the-counter lidocaine patches such as Salonpas.  As we discussed, Follow-up with your primary care doctor to arrange for an outpatient CT scan of your chest to evaluate the lung nodule noted on the kidney CAT scan

## 2020-10-25 NOTE — ED Notes (Signed)
Patient Alert and oriented to baseline. Stable and ambulatory to baseline. Patient verbalized understanding of the discharge instructions.  Patient belongings were taken by the patient.   

## 2020-10-25 NOTE — ED Notes (Signed)
MD at the bedside updating pt

## 2020-10-27 ENCOUNTER — Other Ambulatory Visit: Payer: Self-pay | Admitting: Neurological Surgery

## 2020-10-27 DIAGNOSIS — M545 Low back pain, unspecified: Secondary | ICD-10-CM

## 2020-10-28 ENCOUNTER — Ambulatory Visit
Admission: RE | Admit: 2020-10-28 | Discharge: 2020-10-28 | Disposition: A | Discharge status: Home / Self Care | Payer: Medicare HMO | Source: Ambulatory Visit | Attending: Neurological Surgery | Admitting: Neurological Surgery

## 2020-10-28 DIAGNOSIS — M545 Low back pain, unspecified: Secondary | ICD-10-CM

## 2020-11-03 ENCOUNTER — Encounter: Payer: Medicare HMO | Admitting: Plastic Surgery

## 2020-11-23 ENCOUNTER — Ambulatory Visit: Payer: Medicare HMO | Admitting: Dermatology

## 2020-11-29 ENCOUNTER — Telehealth: Payer: Self-pay | Admitting: Cardiovascular Disease

## 2020-11-29 NOTE — Telephone Encounter (Signed)
I did not need this encounter, pt decided to keep her Sleep Clinic appointment. She thought it was for a Sleep Study

## 2020-12-21 ENCOUNTER — Encounter: Payer: Medicare HMO | Admitting: Surgical

## 2020-12-21 ENCOUNTER — Encounter: Payer: Self-pay | Admitting: Surgical

## 2020-12-21 ENCOUNTER — Ambulatory Visit (INDEPENDENT_AMBULATORY_CARE_PROVIDER_SITE_OTHER): Payer: Medicare HMO | Admitting: Surgical

## 2020-12-21 ENCOUNTER — Other Ambulatory Visit: Payer: Self-pay

## 2020-12-21 VITALS — BP 149/87 | HR 83 | Ht 63.0 in | Wt 227.6 lb

## 2020-12-21 DIAGNOSIS — Z411 Encounter for cosmetic surgery: Secondary | ICD-10-CM

## 2020-12-21 DIAGNOSIS — M793 Panniculitis, unspecified: Secondary | ICD-10-CM

## 2020-12-21 NOTE — Progress Notes (Signed)
Patient ID: Allison Potts, female    DOB: 29-Sep-1960, 60 y.o.   MRN: [id number]  Chief Complaint  Patient presents with   Pre-op Exam      ICD-10-CM   1. Panniculitis  M79.3     2. Encounter for cosmetic procedure  Z41.1       History of Present Illness: Allison Potts is a 60 y.o.  female  with a history of panniculitis.  She presents for preoperative evaluation for upcoming procedure, infraumbilical panniculectomy combined with upper abdominal liposuction and plication with Dr. Arita Miss.  Of note patient did have a previous preoperative appointment for the scheduled procedure on 10/13/2020 with Dr. Arita Miss and they discussed all of the risks and possible postoperative complications during that encounter.  She was sent in Norco (30 tablets) and Zofran.  The patient has not had problems with anesthesia. No history of DVT/PE.  No family history of DVT/PE.  No family or personal history of bleeding or clotting disorders.  Patient is not currently taking any blood thinners.  No history of CVA/MI.   PMH Significant for: History of atrial fibrillation -reports being diagnosed in June 2021. Fibromyalgia History of OSA, not on CPAP. History of back pain, history of prediabetes.  Patient does not want any blood transfusions if necessary**  No recent changes in patient's health, no chest pain or shortness of breath.  She reports that she had a preop with Dr. Arita Miss previously and is aware of the surgical plan and the expected incisions.  She has some questions about realistic expectations.  Past Medical History: Allergies: Allergies  Allergen Reactions   Penicillins Hives, Anaphylaxis and Rash    Passing out, lumps and bumps, was given epi Other reaction(s): Hives/Urticaria Trouble Breathing  Other reaction(s): Hives/Urticaria Trouble Breathing  Trouble Breathing     Lamotrigine Rash    Other reaction(s): Hives/Urticaria    Current Medications:  Current Outpatient Medications:     amphetamine-dextroamphetamine (ADDERALL XR) 10 MG 24 hr capsule, Take 10 mg by mouth daily. Patient current had medication increase to 20 mg, Disp: , Rfl:    buPROPion (WELLBUTRIN SR) 150 MG 12 hr tablet, Take 150 mg by mouth 2 (two) times daily., Disp: , Rfl:    gabapentin (NEURONTIN) 300 MG capsule, , Disp: , Rfl:    losartan (COZAAR) 50 MG tablet, Take 1 tablet by mouth daily., Disp: , Rfl:    OVER THE COUNTER MEDICATION, Vitamin C-Take 1 tablet daily., Disp: , Rfl:    OVER THE COUNTER MEDICATION, MVI-Take 1 by mouth daily., Disp: , Rfl:    tiZANidine (ZANAFLEX) 4 MG tablet, , Disp: , Rfl:    amphetamine-dextroamphetamine (ADDERALL) 10 MG tablet, Take 10 mg by mouth 2 (two) times daily., Disp: , Rfl:    carisoprodol (SOMA) 250 MG tablet, TAKE 1 TABLET TWICE A DAY AS NEEDED FOR PAIN, Disp: , Rfl:    fluticasone (FLONASE) 50 MCG/ACT nasal spray, , Disp: , Rfl:    gabapentin (NEURONTIN) 100 MG capsule, Take 1 capsule by mouth 2 (two) times daily., Disp: , Rfl:    HYDROcodone-acetaminophen (NORCO) 5-325 MG tablet, Take 1 tablet by mouth every 4 (four) hours as needed for moderate pain., Disp: 30 tablet, Rfl: 0   methylPREDNISolone (MEDROL) 4 MG tablet, Take 4 mg by mouth 2 (two) times daily., Disp: , Rfl:    ondansetron (ZOFRAN) 4 MG tablet, Take 1 tablet (4 mg total) by mouth every 8 (eight) hours as needed for nausea or vomiting.,  Disp: 5 tablet, Rfl: 0   OVER THE COUNTER MEDICATION, Collagen-Take 1 daily., Disp: , Rfl:    predniSONE (DELTASONE) 5 MG tablet, , Disp: , Rfl:   Past Medical Problems: Past Medical History:  Diagnosis Date   Arthritis    Atrial fibrillation (HCC)    Fibromyalgia    Mental disorder    PID (acute pelvic inflammatory disease)    her 75s   Thyroid disease    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    Past Surgical History: Past Surgical History:  Procedure Laterality Date   APPENDECTOMY     THYROID LOBECTOMY     TUBAL LIGATION      Social History: Social  History   Socioeconomic History   Marital status: Married    Spouse name: Not on file   Number of children: Not on file   Years of education: Not on file   Highest education level: Not on file  Occupational History   Not on file  Tobacco Use   Smoking status: Never   Smokeless tobacco: Never  Vaping Use   Vaping Use: Never used  Substance and Sexual Activity   Alcohol use: Not Currently   Drug use: Not Currently   Sexual activity: Yes    Birth control/protection: Surgical  Other Topics Concern   Not on file  Social History Narrative   Not on file   Social Determinants of Health   Financial Resource Strain: Not on file  Food Insecurity: Not on file  Transportation Needs: Not on file  Physical Activity: Not on file  Stress: Not on file  Social Connections: Not on file  Intimate Partner Violence: Not on file    Family History: Family History  Problem Relation Age of Onset   Diabetes Mother    Heart disease Mother    Emphysema Mother    Heart disease Father    Diabetes Father     Review of Systems: Review of Systems  Constitutional: Negative.   Respiratory: Negative.    Cardiovascular: Negative.   Gastrointestinal: Negative.   Neurological: Negative.    Physical Exam: Vital Signs BP (!) 149/87 (BP Location: Left Arm, Patient Position: Sitting, Cuff Size: Large)   Pulse 83   Ht 5\' 3"  (1.6 m)   Wt 227 lb 9.6 oz (103.2 kg)   SpO2 97%   BMI 40.32 kg/m   Physical Exam  Constitutional:      General: Not in acute distress.    Appearance: Normal appearance. Not ill-appearing.  HENT:     Head: Normocephalic and atraumatic.  Eyes:     Pupils: Pupils are equal, round Neck:     Musculoskeletal: Normal range of motion.  Cardiovascular:     Rate and Rhythm: Normal rate    Pulses: Normal pulses.  Pulmonary:     Effort: Pulmonary effort is normal. No respiratory distress.  Abdominal:     General: Abdomen is flat. There is no distension.  Musculoskeletal:  Normal range of motion.  Skin:    General: Skin is warm and dry.     Findings: No erythema or rash.  Neurological:     General: No focal deficit present.     Mental Status: Alert and oriented to person, place, and time. Mental status is at baseline.     Motor: No weakness.  Psychiatric:        Mood and Affect: Mood normal.        Behavior: Behavior normal.    Assessment/Plan: The patient  is scheduled for infraumbilical panniculectomy with upper abdominal liposuction  and plication with Dr. Arita MissPace.  Risks, benefits, and alternatives of procedure discussed, questions answered and consent obtained.    Smoking Status: Non-smoker; Counseling Given?  N/A  Caprini Score: 6, high; Risk Factors include: Age, BMI greater than 40, and length of planned surgery. Recommendation for mechanical prophylaxis. Encourage early ambulation.   Pictures obtained: @consult   Post-op Rx sent to pharmacy: Patient previously prescribed Norco and Zofran by Dr. Arita MissPace.  Patient reports that she used some of the Norco when she had recent back pain flareup, I discussed with her that she should check to see if she has any of the Norco left that was prescribed by Dr. Arita MissPace and if she has 20 pills or more than I will not send additional prescription.  If she does not have 20 pills then I will send some postoperatively on the day of surgery.  Patient was provided with the General Surgical Risk consent document and Pain Medication Agreement prior to their appointment.  They had adequate time to read through the risk consent documents and Pain Medication Agreement. We also discussed them in person together during this preop appointment. All of their questions were answered to their satisfaction.  Recommended calling if they have any further questions.  Risk consent form and Pain Medication Agreement to be scanned into patient's chart.  The risk that can be encountered for this procedure were discussed and include the following but  not limited to these: asymmetry, fluid accumulation, firmness of the tissue, skin loss, decrease or no sensation, fat necrosis, bleeding, infection, healing delay.  Deep vein thrombosis, cardiac and pulmonary complications are risks to any procedure.  There are risks of anesthesia, changes to skin sensation and injury to nerves or blood vessels.  The muscle can be temporarily or permanently injured.  You may have an allergic reaction to tape, suture, glue, blood products which can result in skin discoloration, swelling, pain, skin lesions, poor healing.  Any of these can lead to the need for revisonal surgery or stage procedures.  Weight gain and weigh loss can also effect the long term appearance. The results are not guaranteed to last a lifetime.  Future surgery may be required.    The risks that can be encountered with and after liposuction were discussed and include the following but no limited to these:  Asymmetry, fluid accumulation, firmness of the area, fat necrosis with death of fat tissue, bleeding, infection, delayed healing, anesthesia risks, skin sensation changes, injury to structures including nerves, blood vessels, and muscles which may be temporary or permanent, allergies to tape, suture materials and glues, blood products, topical preparations or injected agents, skin and contour irregularities, skin discoloration and swelling, deep vein thrombosis, cardiac and pulmonary complications, pain, which may persist, persistent pain, recurrence of the lesion, poor healing of the incision, possible need for revisional surgery or staged procedures. Thiere can also be persistent swelling, poor wound healing, rippling or loose skin, worsening of cellulite, swelling, and thermal burn or heat injury from ultrasound with the ultrasound-assisted lipoplasty technique. Any change in weight fluctuations can alter the outcome.  Patient had preop evaluation with cardiology on 10/05/2020, they noted patient has good  functional capacity and can perform greater than 4 METS.  No cardiac work-up recommended prior to surgery.  She did have an echocardiogram shortly after this appointment.  Echocardiogram showed normal function with no significant valvular disease.  No further work-up necessary prior to surgery.  Patient  will need to stay overnight in [facility] for observation due to history of OSA and not on CPAP.  This has been discussed with the patient.  Electronically signed by: Kermit Balo Harinder Romas, PA-C 12/21/2020 9:40 AM

## 2020-12-27 ENCOUNTER — Telehealth: Payer: Self-pay | Admitting: Plastic Surgery

## 2020-12-27 ENCOUNTER — Encounter: Payer: Self-pay | Admitting: Surgical

## 2020-12-27 DIAGNOSIS — M793 Panniculitis, unspecified: Secondary | ICD-10-CM | POA: Diagnosis not present

## 2020-12-27 NOTE — Telephone Encounter (Signed)
Pt's sister reported that pt is bleeding through her bandages. I adv her to leave the bandages on for the next 3 days and to not remove them. It is normal for blood to show through the dressings because the pt just had SX. She conveyed understanding.   Pt's sister then put the pt on the phone and she inquired whether or not she could take the hydrocodone every 4 hours vs every 6 hours. I talked to [place], [place] (in real time by telephone) and he adv pt to only take the hydrocodone every 6 hours and to alternate Tylenol in between. [facility] not to take ibuprofen when she was D/C. Pt conveyed understanding of Matt's instructions.

## 2020-12-27 NOTE — Telephone Encounter (Signed)
Patient's sister called on behalf regarding blood on patient's bandages and pain medication. Please update/ advise. SX 8/30. Patient gave verbal consent to share detailed information with Doree Fudge. Thank you.  Allison Potts- sister [phone]

## 2020-12-28 ENCOUNTER — Telehealth: Payer: Self-pay

## 2020-12-28 NOTE — Telephone Encounter (Signed)
Adv pt that Tizanidine is fine but is not recommended to take it together with hydrocodone per [facility], PA-C due to sedation risk. Pt conveyed understanding.

## 2020-12-28 NOTE — Telephone Encounter (Signed)
Patient is calling to say that her hydrocodone is not working.  Patient said that she had some Tizamadine on-hand which her regular doctor prescribed for her and she took it.  Patient would like to know if it's ok for her to take the Tizamadine.  Please call.  *Patient's preferred pharmacy is [facility] on [address].

## 2020-12-30 ENCOUNTER — Telehealth: Payer: Self-pay

## 2020-12-30 NOTE — Telephone Encounter (Signed)
Called and spoke with the patient regarding the message below.  She stated that she's having leakage from the tubing near her skin, and she's having swelling in (B) ankles x 2 days.   Informed her that I spoke with [facility] regarding her message, and he stated that the swelling could be coming from after having surgery.  So she should elevate both her legs as much as she can, and if she has any compression stockings she can put them on.  But if she's really worried she can go to the ED or call the doctor on call.  Informed the patient she can get some gauze and place vaseline on it and place the gauze under the drain tubing near her skin where it's leaking to help with the leakage.    Patient asked if she has to keep the drains in, and I informed her that yes she will need to keep the drains in because it's helping with keeping the fluid out.  Asked the patient if she's having any SOB,cramping,redness,or chest pain.  Patient stated she not having any of those symptoms.//AB/CMA

## 2020-12-30 NOTE — Telephone Encounter (Signed)
Patient called to say that she is leaking from her side and would like to know if it is normal.  Patient would like to know if she should keep her bulbs in until she comes back to see [place].  Please call.

## 2021-01-03 ENCOUNTER — Encounter: Payer: Medicare HMO | Admitting: Surgical

## 2021-01-04 ENCOUNTER — Ambulatory Visit (INDEPENDENT_AMBULATORY_CARE_PROVIDER_SITE_OTHER): Payer: Medicare HMO | Admitting: Surgical

## 2021-01-04 ENCOUNTER — Other Ambulatory Visit: Payer: Self-pay

## 2021-01-04 DIAGNOSIS — Z411 Encounter for cosmetic surgery: Secondary | ICD-10-CM

## 2021-01-04 DIAGNOSIS — M793 Panniculitis, unspecified: Secondary | ICD-10-CM

## 2021-01-04 NOTE — Progress Notes (Signed)
Patient is a 60 year old female here for follow-up after abdominoplasty with Dr. Arita Miss 1 week ago.  She is doing really well, she is here with her sister.  She is not having any infectious symptoms.  She reports that she has been recording the drain output, but has not been recording it separately.  She feels as if the right side has been putting out more than the left side.  She reports approximately 40 to 50 cc per 24 hours for each drain.  She has some questions about postop recovery, postop restrictions and what to expect.  Chaperone present on exam On exam umbilicus incision is intact, healing well, umbilicus is viable.  She has some bruising noted over her abdomen which is expected, it is actually very minimal.  Steri-Strips are in place, incision is intact and healing well.  Bilateral JP drains are in place with serosanguineous drainage in bulbs.  There is no erythema or cellulitic changes noted.  She does have a little bit of irritation surrounding the JP drains, but no signs of infection.  Her abdomen is nontender with palpation.  I do not palpate any subcutaneous fluid collections.  Recommend continue her compressive garment 24/7, she can shower if she would like.  Avoid submerging the incisions in water.  Avoid strenuous activities for 6 to 8 weeks.  Recommend dressings to abdominal incisions daily to help with any drainage.  Discussed Steri-Strips will continue to follow off over the next few weeks.  Recommend following up in 1 week for removal of right JP drain depending on output.  Recommend calling with questions or concerns.  I do not see any sign of infection, seroma, hematoma.

## 2021-01-05 ENCOUNTER — Ambulatory Visit: Payer: Medicare HMO | Admitting: Cardiovascular Disease

## 2021-01-11 ENCOUNTER — Ambulatory Visit (INDEPENDENT_AMBULATORY_CARE_PROVIDER_SITE_OTHER): Payer: Medicare HMO | Admitting: Plastic Surgery

## 2021-01-11 ENCOUNTER — Telehealth: Payer: Self-pay

## 2021-01-11 DIAGNOSIS — M793 Panniculitis, unspecified: Secondary | ICD-10-CM

## 2021-01-11 NOTE — Progress Notes (Signed)
Patient presents 2 weeks postop from infraumbilical panniculectomy combined with upper abdominal liposuction and plication.  She feels like she is doing well.  She is doing a lot of functional things around the house and would like to do more chores and cleaning and such.  She is having very minimal pain.  On exam her incision looks to be doing great and her contour is looking good as well.  No obvious subcutaneous fluid and very minimal bruising.  Her drain is still putting out somewhere between 30 and 50 cc/day so we will plan to leave it for another week.  I encouraged her to avoid strenuous activity and continue compressive garments we will see her next week to hopefully remove her drain.  All of her questions were answered.

## 2021-01-11 NOTE — Telephone Encounter (Signed)
Patient called requesting call back from clinical staff. She would like to know if her taking Tizanidine 3-4 times a week at night is related to her still having drainage.

## 2021-01-12 NOTE — Telephone Encounter (Signed)
Returned patients call. LMVM, Tizanidine would not cause excessive drainage. Being over active could possible produce higher amounts of fluid. Call office back if she has any further questions.

## 2021-01-17 NOTE — Progress Notes (Signed)
Patient is a 60 year old female here for follow-up after infraumbilical panniculectomy combined with upper abdominal liposuction and plication with Dr. Arita Miss 3 weeks ago.  She reports she is doing well, has some tenderness within the mons area.  She has been using some ice in this area with mild improvement.  She reports today is much better than yesterday.  She still has her right JP drain in place that has been putting out much less fluid over the last few days.  She reports approximately 70 cc per 24 hours a week ago and it is down to about approximately 45 cc per 24 hours.  On exam her incisions are intact and she has no tension on the incisions.  No subcutaneous fluid collections are noted with palpation.  The drainage from the JP drain is serous.  The umbilicus incision is intact and the umbilicus is viable.  Recommend continue to avoid strenuous activities and continue compressive garments.  Recommend following up in 3 weeks for reevaluation.  All of her questions were answered to her content.

## 2021-01-18 ENCOUNTER — Ambulatory Visit (INDEPENDENT_AMBULATORY_CARE_PROVIDER_SITE_OTHER): Payer: Medicare HMO | Admitting: Surgical

## 2021-01-18 ENCOUNTER — Other Ambulatory Visit: Payer: Self-pay

## 2021-01-18 DIAGNOSIS — Z411 Encounter for cosmetic surgery: Secondary | ICD-10-CM

## 2021-01-18 DIAGNOSIS — M793 Panniculitis, unspecified: Secondary | ICD-10-CM

## 2021-01-24 ENCOUNTER — Telehealth: Payer: Self-pay

## 2021-01-24 NOTE — Telephone Encounter (Signed)
FD will you call pt to get her on Matt's schedule for this Thursday please for a f/u x abd pain and abd being warm to touch?  Thanks!

## 2021-01-24 NOTE — Telephone Encounter (Signed)
Patient called to say that she had surgery with Dr. Arita Miss on 12/27/20 and last night she noticed that the area below her belly button was really hard and she felt sharp pains for 1-2 minutes.  Patient said that she is not experiencing sharp pains now, but the area is really hot.  She would like to know if this is normal.  Please call.

## 2021-01-24 NOTE — Telephone Encounter (Signed)
I can see patient Thursday if she would like, but it does sound normal based on her description.

## 2021-01-24 NOTE — Telephone Encounter (Signed)
Pt c/o intermittent pain under belly button and mons area. Pt reports that both areas are still hot but has been icing it 2-3 times a day. Per Gerre Pebbles, the hardness she feels is normal but adv to send message to [place], [place] due to him following pt since SX. Adv pt that I would send message to [facility] to address the hotness she feels but the hardness is normal.   Pt stated that FD was supposed to call her to sch a 4 week f/u because she had another appt that was coinciding with the date that she was supposed to come to Plastics but it hasn't been scheduled yet. Adv her that after Susy Frizzle addressed the message, I would wait to see if he wanted to bring her in sooner. Also adv that he has been in [place] all day and it will probably be tomorrow when he's able to respond. Pt conveyed understanding.

## 2021-01-25 ENCOUNTER — Encounter: Payer: Medicare HMO | Admitting: Plastic Surgery

## 2021-01-26 ENCOUNTER — Ambulatory Visit (INDEPENDENT_AMBULATORY_CARE_PROVIDER_SITE_OTHER): Payer: Medicare HMO | Admitting: Surgical

## 2021-01-26 ENCOUNTER — Other Ambulatory Visit: Payer: Self-pay

## 2021-01-26 DIAGNOSIS — M793 Panniculitis, unspecified: Secondary | ICD-10-CM

## 2021-01-26 DIAGNOSIS — Z411 Encounter for cosmetic surgery: Secondary | ICD-10-CM

## 2021-01-26 NOTE — Progress Notes (Signed)
Patient is a 60 year old female here for follow-up after panniculectomy combined with upper abdominal liposuction and plication with Dr. Arita Miss 4 weeks ago.  She is here today for concerns of tenderness of her lower abdomen and some fullness.  She is not having any infectious symptoms. She does report her sister is recovering from a joint surgery and she was helping her a little bit with this but avoiding any heavy lifting.  Chaperone present on exam On exam abdominal incision is intact and healing very nicely, umbilicus is viable and incisions are healing well.  On exam of her abdomen I do not appreciate any subcutaneous fluid, but she may have a little bit of subcutaneous fluid with palpation of the mons pubis.  There is no tension on the incision.  There is no overlying skin changes noted.  There is no significant tenderness with palpation.    Recommend continuing to wear compressive garment and avoid strenuous activities.  Recommend wearing spanks in addition to the abdominal binder to increase compression on the mons pubis to decrease any swelling in this area.  I do not see any signs of infection on exam.  Exam was reassuring, recommend keeping her follow-up scheduled for 02/23/2021 and calling with any questions or concerns prior to this.

## 2021-02-01 ENCOUNTER — Encounter: Payer: Medicare HMO | Admitting: Plastic Surgery

## 2021-02-02 ENCOUNTER — Ambulatory Visit (INDEPENDENT_AMBULATORY_CARE_PROVIDER_SITE_OTHER): Payer: Medicare HMO | Admitting: Surgical

## 2021-02-02 ENCOUNTER — Other Ambulatory Visit: Payer: Self-pay

## 2021-02-02 DIAGNOSIS — Z411 Encounter for cosmetic surgery: Secondary | ICD-10-CM

## 2021-02-02 DIAGNOSIS — M793 Panniculitis, unspecified: Secondary | ICD-10-CM

## 2021-02-02 NOTE — Progress Notes (Signed)
60 year old female here for follow-up after abdominoplasty with Dr. Arita Miss approximately 5 weeks ago.  She is here today because she was shopping and suddenly noticed some swelling in her lower abdomen and was very concerned about this.  She is otherwise feeling well.  Chaperone present on exam On exam abdominal incision is intact and healing very well, umbilicus is intact and healing well.  There is no skin necrosis noted.  I do appreciate some swelling within the mons pubis and lower abdomen but there is no tension on the incisions and there is no overlying skin changes noted.  I do not appreciate any large fluid collections with palpation.  Reassured patient that her exam is overall quite normal for this point in recovery.  I do not see any signs of infection.  I recommend continue to wear compressive garments and avoiding strenuous activities.  We discussed restrictions and all of her questions were answered to her content.  Pictures were taken and placed in the patient's chart with patient's permission.

## 2021-02-13 ENCOUNTER — Ambulatory Visit: Payer: Medicare HMO | Admitting: Cardiology

## 2021-02-23 ENCOUNTER — Ambulatory Visit (INDEPENDENT_AMBULATORY_CARE_PROVIDER_SITE_OTHER): Payer: Medicare HMO | Admitting: Surgical

## 2021-02-23 ENCOUNTER — Encounter: Payer: Self-pay | Admitting: Surgical

## 2021-02-23 ENCOUNTER — Other Ambulatory Visit: Payer: Self-pay

## 2021-02-23 DIAGNOSIS — Z411 Encounter for cosmetic surgery: Secondary | ICD-10-CM

## 2021-02-23 DIAGNOSIS — M793 Panniculitis, unspecified: Secondary | ICD-10-CM

## 2021-02-23 NOTE — Progress Notes (Signed)
Patient is a 60 year old female here for follow-up after panniculectomy with upper abdomen add-on.  She is approximately 8 weeks postop.  She is doing really well.  She reports that she has noticed increased tenderness of her abdomen with increased activity.  She is not having any infectious symptoms.  She is still wearing her abdominal girdle.  She has some questions about restrictions.  Chaperone present on exam On exam umbilicus is intact and healing well.  It is viable.  No bruising or subcutaneous fluid collections noted with palpation.  Minimal swelling is present.  There is no erythema or cellulitic changes noted.  Incisions are all intact and healing well.  She has a good contour  No restrictions at this time.  Recommend increasing activity as tolerated.  Recommend calling with questions or concerns.  Recommend following up as needed.

## 2021-02-24 ENCOUNTER — Ambulatory Visit: Payer: Medicare HMO | Admitting: Cardiology

## 2021-02-24 ENCOUNTER — Ambulatory Visit: Payer: Medicare HMO | Admitting: Cardiovascular Disease

## 2021-03-06 ENCOUNTER — Ambulatory Visit: Payer: Medicare HMO | Admitting: Cardiovascular Disease

## 2021-03-16 ENCOUNTER — Ambulatory Visit
Admission: RE | Admit: 2021-03-16 | Discharge: 2021-03-16 | Disposition: A | Discharge status: Home / Self Care | Payer: Medicare HMO | Source: Ambulatory Visit | Attending: Internal Medicine | Admitting: Internal Medicine

## 2021-03-16 ENCOUNTER — Other Ambulatory Visit: Payer: Self-pay | Admitting: Internal Medicine

## 2021-03-16 DIAGNOSIS — M542 Cervicalgia: Secondary | ICD-10-CM

## 2021-04-19 ENCOUNTER — Telehealth: Payer: Self-pay | Admitting: Plastic Surgery

## 2021-04-19 NOTE — Telephone Encounter (Signed)
Left message for patient with instructions.

## 2021-04-19 NOTE — Telephone Encounter (Signed)
Patient would like to know what kind of exercise is allowed at this point following her surgery.  She swims and she states that she has experienced tightness and is afraid of messing something up that was done during surgery.  Please follow up

## 2021-05-24 ENCOUNTER — Ambulatory Visit: Payer: Medicare HMO | Admitting: Dermatology

## 2021-05-29 DIAGNOSIS — R7303 Prediabetes: Secondary | ICD-10-CM | POA: Diagnosis present

## 2021-05-29 DIAGNOSIS — I4891 Unspecified atrial fibrillation: Secondary | ICD-10-CM | POA: Diagnosis present

## 2021-05-31 DIAGNOSIS — Z531 Procedure and treatment not carried out because of patient's decision for reasons of belief and group pressure: Secondary | ICD-10-CM

## 2021-06-05 ENCOUNTER — Encounter: Payer: Self-pay | Admitting: Cardiovascular Disease

## 2021-06-09 ENCOUNTER — Ambulatory Visit: Payer: Medicare HMO | Admitting: Cardiovascular Disease

## 2021-06-14 LAB — COLOGUARD: COLOGUARD: NEGATIVE

## 2021-06-30 ENCOUNTER — Ambulatory Visit (HOSPITAL_COMMUNITY)
Admission: EM | Admit: 2021-06-30 | Discharge: 2021-06-30 | Disposition: A | Discharge status: Home / Self Care | Payer: Medicare (Managed Care) | Attending: Internal Medicine | Admitting: Internal Medicine

## 2021-06-30 ENCOUNTER — Encounter (HOSPITAL_COMMUNITY): Payer: Self-pay

## 2021-06-30 DIAGNOSIS — G4486 Cervicogenic headache: Secondary | ICD-10-CM

## 2021-06-30 MED ORDER — METHOCARBAMOL 500 MG PO TABS: 500.0000 mg | ORAL_TABLET | Freq: Every evening | ORAL | 0 refills | Status: DC | PRN

## 2021-06-30 NOTE — Discharge Instructions (Signed)
Heating pad use on a 20-minute on-20 minutes off cycle ?You can take up to 600 mg of ibuprofen every 6 hours as needed for pain ?Gentle range of motion exercises ?Take muscle relaxants as prescribed ?Please do not drive or operate heavy machinery after taking muscle relaxants. ?
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
## 2021-08-01 ENCOUNTER — Emergency Department (HOSPITAL_COMMUNITY): Payer: Medicare (Managed Care)

## 2021-08-01 ENCOUNTER — Encounter (HOSPITAL_COMMUNITY): Payer: Self-pay

## 2021-08-01 ENCOUNTER — Ambulatory Visit (HOSPITAL_COMMUNITY)
Admission: EM | Admit: 2021-08-01 | Discharge: 2021-08-01 | Payer: Medicare (Managed Care) | Attending: Internal Medicine | Admitting: Internal Medicine

## 2021-08-01 ENCOUNTER — Other Ambulatory Visit: Payer: Self-pay

## 2021-08-01 ENCOUNTER — Observation Stay (HOSPITAL_COMMUNITY)
Admission: EM | Admit: 2021-08-01 | Discharge: 2021-08-02 | Disposition: A | Discharge status: Home / Self Care | Payer: Medicare (Managed Care) | Attending: Internal Medicine | Admitting: Internal Medicine

## 2021-08-01 ENCOUNTER — Encounter (HOSPITAL_COMMUNITY): Payer: Self-pay | Admitting: Emergency Medicine

## 2021-08-01 DIAGNOSIS — Z85850 Personal history of malignant neoplasm of thyroid: Secondary | ICD-10-CM | POA: Insufficient documentation

## 2021-08-01 DIAGNOSIS — Z79899 Other long term (current) drug therapy: Secondary | ICD-10-CM | POA: Insufficient documentation

## 2021-08-01 DIAGNOSIS — F32A Depression, unspecified: Secondary | ICD-10-CM | POA: Diagnosis present

## 2021-08-01 DIAGNOSIS — I480 Paroxysmal atrial fibrillation: Secondary | ICD-10-CM | POA: Diagnosis not present

## 2021-08-01 DIAGNOSIS — R0789 Other chest pain: Secondary | ICD-10-CM | POA: Diagnosis not present

## 2021-08-01 DIAGNOSIS — F419 Anxiety disorder, unspecified: Secondary | ICD-10-CM | POA: Diagnosis present

## 2021-08-01 DIAGNOSIS — R0602 Shortness of breath: Secondary | ICD-10-CM | POA: Diagnosis not present

## 2021-08-01 DIAGNOSIS — G4733 Obstructive sleep apnea (adult) (pediatric): Secondary | ICD-10-CM | POA: Diagnosis present

## 2021-08-01 DIAGNOSIS — I10 Essential (primary) hypertension: Secondary | ICD-10-CM | POA: Diagnosis not present

## 2021-08-01 DIAGNOSIS — IMO0001 Reserved for inherently not codable concepts without codable children: Secondary | ICD-10-CM

## 2021-08-01 DIAGNOSIS — I4891 Unspecified atrial fibrillation: Secondary | ICD-10-CM | POA: Diagnosis present

## 2021-08-01 DIAGNOSIS — Z7984 Long term (current) use of oral hypoglycemic drugs: Secondary | ICD-10-CM | POA: Diagnosis not present

## 2021-08-01 DIAGNOSIS — R079 Chest pain, unspecified: Secondary | ICD-10-CM | POA: Diagnosis present

## 2021-08-01 DIAGNOSIS — E876 Hypokalemia: Secondary | ICD-10-CM

## 2021-08-01 DIAGNOSIS — R9431 Abnormal electrocardiogram [ECG] [EKG]: Secondary | ICD-10-CM | POA: Diagnosis not present

## 2021-08-01 DIAGNOSIS — R911 Solitary pulmonary nodule: Secondary | ICD-10-CM | POA: Insufficient documentation

## 2021-08-01 DIAGNOSIS — M797 Fibromyalgia: Secondary | ICD-10-CM | POA: Diagnosis present

## 2021-08-01 DIAGNOSIS — R7303 Prediabetes: Secondary | ICD-10-CM | POA: Diagnosis not present

## 2021-08-01 DIAGNOSIS — Z20822 Contact with and (suspected) exposure to covid-19: Secondary | ICD-10-CM | POA: Diagnosis not present

## 2021-08-01 DIAGNOSIS — E890 Postprocedural hypothyroidism: Secondary | ICD-10-CM | POA: Diagnosis present

## 2021-08-01 DIAGNOSIS — Z531 Procedure and treatment not carried out because of patient's decision for reasons of belief and group pressure: Secondary | ICD-10-CM

## 2021-08-01 LAB — BRAIN NATRIURETIC PEPTIDE: B Natriuretic Peptide: 14 pg/mL (ref 0.0–100.0)

## 2021-08-01 LAB — CBC
HCT: 41.3 % (ref 36.0–46.0)
Hemoglobin: 14.1 g/dL (ref 12.0–15.0)
MCH: 30.3 pg (ref 26.0–34.0)
MCHC: 34.1 g/dL (ref 30.0–36.0)
MCV: 88.8 fL (ref 80.0–100.0)
Platelets: 297 10*3/uL (ref 150–400)
RBC: 4.65 MIL/uL (ref 3.87–5.11)
RDW: 12.8 % (ref 11.5–15.5)
WBC: 7.4 10*3/uL (ref 4.0–10.5)
nRBC: 0 % (ref 0.0–0.2)

## 2021-08-01 LAB — BASIC METABOLIC PANEL
Anion gap: 7 (ref 5–15)
BUN: 8 mg/dL (ref 8–23)
CO2: 28 mmol/L (ref 22–32)
Calcium: 8.7 mg/dL — ABNORMAL LOW (ref 8.9–10.3)
Chloride: 106 mmol/L (ref 98–111)
Creatinine, Ser: 0.66 mg/dL (ref 0.44–1.00)
GFR, Estimated: >60 mL/min (ref >60)
Glucose, Bld: 99 mg/dL (ref 70–99)
Potassium: 3.2 mmol/L — ABNORMAL LOW (ref 3.5–5.1)
Sodium: 141 mmol/L (ref 135–145)

## 2021-08-01 LAB — D-DIMER, QUANTITATIVE: D-Dimer, Quant: 0.52 ug/mL-FEU — ABNORMAL HIGH (ref 0.00–0.50)

## 2021-08-01 LAB — TROPONIN I (HIGH SENSITIVITY)
Troponin I (High Sensitivity): 6 ng/L (ref <18)
Troponin I (High Sensitivity): 5 ng/L (ref <18)

## 2021-08-01 LAB — MAGNESIUM: Magnesium: 2 mg/dL (ref 1.7–2.4)

## 2021-08-01 LAB — RESP PANEL BY RT-PCR (FLU A&B, COVID) ARPGX2
Influenza A by PCR: NEGATIVE
Influenza B by PCR: NEGATIVE
SARS Coronavirus 2 by RT PCR: NEGATIVE

## 2021-08-01 MED ORDER — NITROGLYCERIN 0.4 MG SL SUBL
0.4000 mg | SUBLINGUAL_TABLET | SUBLINGUAL | Status: DC | PRN
  Administered 2021-08-01: 0.4 mg via SUBLINGUAL
  Filled 2021-08-01: qty 1

## 2021-08-01 MED ORDER — ONDANSETRON HCL 4 MG/2ML IJ SOLN: 4.0000 mg | Freq: Four times a day (QID) | INTRAMUSCULAR | Status: DC | PRN

## 2021-08-01 MED ORDER — ACETAMINOPHEN 500 MG PO TABS
1000.0000 mg | ORAL_TABLET | Freq: Once | ORAL | Status: AC
  Administered 2021-08-01: 1000 mg via ORAL
  Filled 2021-08-01: qty 2

## 2021-08-01 MED ORDER — METOPROLOL TARTRATE 25 MG PO TABS
25.0000 mg | ORAL_TABLET | Freq: Two times a day (BID) | ORAL | Status: DC
Start: 2021-08-01 — End: 2021-08-02
  Administered 2021-08-01 – 2021-08-02 (×2): 25 mg via ORAL
  Filled 2021-08-01 (×2): qty 1

## 2021-08-01 MED ORDER — ENOXAPARIN SODIUM 40 MG/0.4ML IJ SOSY
40.0000 mg | PREFILLED_SYRINGE | INTRAMUSCULAR | Status: DC
  Administered 2021-08-01: 40 mg via SUBCUTANEOUS
  Filled 2021-08-01 (×2): qty 0.4

## 2021-08-01 MED ORDER — LOSARTAN POTASSIUM 50 MG PO TABS
100.0000 mg | ORAL_TABLET | Freq: Every day | ORAL | Status: DC
  Administered 2021-08-01 – 2021-08-02 (×2): 100 mg via ORAL
  Filled 2021-08-01 (×2): qty 2

## 2021-08-01 MED ORDER — GABAPENTIN 300 MG PO CAPS
300.0000 mg | ORAL_CAPSULE | Freq: Two times a day (BID) | ORAL | Status: DC
  Administered 2021-08-01 – 2021-08-02 (×3): 300 mg via ORAL
  Filled 2021-08-01 (×3): qty 1

## 2021-08-01 MED ORDER — BUPROPION HCL ER (XL) 150 MG PO TB24
300.0000 mg | ORAL_TABLET | Freq: Every day | ORAL | Status: DC
  Administered 2021-08-01 – 2021-08-02 (×2): 300 mg via ORAL
  Filled 2021-08-01: qty 2
  Filled 2021-08-01: qty 1

## 2021-08-01 MED ORDER — HYDRALAZINE HCL 20 MG/ML IJ SOLN
10.0000 mg | Freq: Once | INTRAMUSCULAR | Status: AC
  Administered 2021-08-01: 10 mg via INTRAVENOUS
  Filled 2021-08-01: qty 1

## 2021-08-01 MED ORDER — LORAZEPAM 1 MG PO TABS
1.0000 mg | ORAL_TABLET | Freq: Once | ORAL | Status: AC
  Administered 2021-08-01: 1 mg via ORAL
  Filled 2021-08-01: qty 1

## 2021-08-01 MED ORDER — FLUTICASONE PROPIONATE 50 MCG/ACT NA SUSP: 2.0000 | Freq: Every day | NASAL | Status: DC | PRN

## 2021-08-01 MED ORDER — LIDOCAINE VISCOUS HCL 2 % MT SOLN
15.0000 mL | Freq: Once | OROMUCOSAL | Status: AC
  Administered 2021-08-01: 15 mL via ORAL
  Filled 2021-08-01: qty 15

## 2021-08-01 MED ORDER — HYDROCODONE-ACETAMINOPHEN 7.5-325 MG PO TABS
1.0000 | ORAL_TABLET | Freq: Four times a day (QID) | ORAL | Status: DC | PRN
  Administered 2021-08-01: 1 via ORAL
  Filled 2021-08-01: qty 1

## 2021-08-01 MED ORDER — ACETAMINOPHEN 325 MG PO TABS: 650.0000 mg | ORAL_TABLET | ORAL | Status: DC | PRN

## 2021-08-01 MED ORDER — QUETIAPINE FUMARATE 50 MG PO TABS
50.0000 mg | ORAL_TABLET | Freq: Every day | ORAL | Status: DC
  Administered 2021-08-01: 50 mg via ORAL
  Filled 2021-08-01 (×2): qty 1

## 2021-08-01 MED ORDER — ALUM & MAG HYDROXIDE-SIMETH 200-200-20 MG/5ML PO SUSP
30.0000 mL | Freq: Once | ORAL | Status: AC
Start: 2021-08-01 — End: 2021-08-01
  Administered 2021-08-01: 30 mL via ORAL
  Filled 2021-08-01: qty 30

## 2021-08-01 MED ORDER — ASPIRIN 325 MG PO TABS
325.0000 mg | ORAL_TABLET | Freq: Every day | ORAL | Status: DC
  Administered 2021-08-01: 325 mg via ORAL
  Filled 2021-08-01: qty 1

## 2021-08-01 MED ORDER — METOPROLOL TARTRATE 25 MG PO TABS: 25.0000 mg | ORAL_TABLET | Freq: Two times a day (BID) | ORAL | Status: DC

## 2021-08-01 MED ORDER — ASPIRIN EC 81 MG PO TBEC
81.0000 mg | DELAYED_RELEASE_TABLET | Freq: Every day | ORAL | Status: DC
  Administered 2021-08-02: 81 mg via ORAL
  Filled 2021-08-01: qty 1

## 2021-08-01 MED ORDER — POTASSIUM CHLORIDE CRYS ER 20 MEQ PO TBCR
40.0000 meq | EXTENDED_RELEASE_TABLET | Freq: Once | ORAL | Status: AC
  Administered 2021-08-01: 40 meq via ORAL
  Filled 2021-08-01: qty 2

## 2021-08-01 MED ORDER — METOPROLOL TARTRATE 100 MG PO TABS
100.0000 mg | ORAL_TABLET | Freq: Once | ORAL | Status: DC
  Filled 2021-08-01: qty 1

## 2021-08-01 NOTE — Consult Note (Addendum)
?Cardiology Consultation:  ? ?Patient ID: Allison Potts ?MRN: [id number]; DOB: Mar 21, 1961 ? ?Admit date: 08/01/2021 ?Date of Consult: 08/01/2021 ? ?PCP:  Burton Apleyoberts, Ronald, MD ?  ?[facility] Providers ?Cardiologist:  Little Ishikawahristopher L Schumann, MD   { ? ?Patient Profile:  ? ?Allison Potts is a 61 y.o. female with a hx of PAF, OSA, thyroid cancer s/p partial thyroidectomy, depression/anxiety, and fibromyalgia who is being seen 08/01/2021 for the evaluation of chest pain at the request of Dr. Artis FlockWolfe. ? ?History of Present Illness:  ? ?Allison Potts  [place] was seen in the ER and converted to sinus rhythm. She was not anticoagulated at that time. She purchased a Anguillakardia and has been tracking her rhythm. She established care with Dr. Bjorn PippinSchumann 10/05/20 for preop evaluation prior to panniculectomy. She was active and exercised regularly at that time (swimming, cross training, walking). She has a family history of heart disease in her father (CABG in his 570s) and now sister - died of MI at age.  ? ?Given single episode of Afib. Dr. Bjorn PippinSchumann opted to obtain an echocardiogram which revealed normal BiV function and no significant valvular disease.  ? ?HTN treated with losartan. OSA not using CPAP since moving to [place].  ? ?[facility] that started at 11:30 pm last night. CP epigastric and she thought this was consistent with indigestion. She was able to sleep but woke up this morning with the pain. Pain progressively worsened and [facility]. Pain is intermittent and not related to exertion.  ? ?Workup so far in the ER: ?HST x 2 negative ?EKG does not appear ischemic, but with artifact ?D-dimer 0.52 ? ?Cardiology consulted for further workup. She states she has had constant chest pain since last night. She took seroquel and went to bed. When in bed, she felt chest pain in the center of her chest that felt like indigestion. She was able to sleep and woke up this morning with  constant chest pain. CP rated as a 5/10 but worsens intermittently to a 10/10.  Chest pain is not associated with nausea, vomiting, or diaphoresis.  She feels like she cannot take a deep breath.  Chest pain is worse and sharp with deep inspiration.  Chest pain does not radiate and is not associated with exertion.  She states chest pain has been constant since last evening.  She denies recent illness.  Chest pain is not changed by changes in position and is not reproducible with palpation. No recent illness. ? ?GI cocktail and nitro did not relieve her chest pain. ? ?Significantly hypertensive, anxiety, pain. ? ? ?Past Medical History:  ?Diagnosis Date  ? Arthritis   ? Atrial fibrillation (HCC)   ? Fibromyalgia   ? Mental disorder   ? PID (acute pelvic inflammatory disease)   ? her 5320s  ? Thyroid disease   ? 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  ? ? ?Past Surgical History:  ?Procedure Laterality Date  ? APPENDECTOMY    ? THYROID LOBECTOMY    ? TUBAL LIGATION    ?  ? ?Home Medications:  ?Prior to Admission medications   ?Medication Sig Start Date End Date Taking? Authorizing Provider  ?buPROPion (WELLBUTRIN XL) 300 MG 24 hr tablet Take 300 mg by mouth daily. 07/24/21  Yes [provider]  ?fluticasone (FLONASE) 50 MCG/ACT nasal spray Place 2 sprays into both nostrils daily as needed for allergies.   Yes [provider]  ?  gabapentin (NEURONTIN) 300 MG capsule Take 300 mg by mouth 2 (two) times daily. 10/18/20  Yes [provider]  ?losartan (COZAAR) 100 MG tablet Take 100 mg by mouth daily. 06/03/21  Yes [provider]  ?metFORMIN (GLUCOPHAGE-XR) 750 MG 24 hr tablet Take 750 mg by mouth every morning. 05/30/21  Yes [provider]  ?Multiple Vitamins-Minerals (MULTIVITAMIN ADULT) CHEW Chew 1 each by mouth daily.   Yes [provider]  ?naproxen sodium (ALEVE) 220 MG tablet Take 660 mg by mouth daily as needed (pain).   Yes [provider]  ?QUEtiapine (SEROQUEL)  50 MG tablet Take 50 mg by mouth at bedtime. 07/10/21   [provider]  ? ? ?Inpatient Medications: ?Scheduled Meds: ? [START ON 08/02/2021] aspirin EC  81 mg Oral Daily  ? buPROPion  300 mg Oral Daily  ? enoxaparin (LOVENOX) injection  40 mg Subcutaneous Q24H  ? gabapentin  300 mg Oral BID  ? losartan  100 mg Oral Daily  ? QUEtiapine  50 mg Oral QHS  ? ?Continuous Infusions: ? ?PRN Meds: ?acetaminophen, fluticasone, HYDROcodone-acetaminophen, nitroGLYCERIN, ondansetron (ZOFRAN) IV ? ?Allergies:    ?Allergies  ?Allergen Reactions  ? Penicillins Hives, Anaphylaxis and Rash  ?  Passing out, lumps and bumps, was given epi ?Other reaction(s): Hives/Urticaria ?Trouble Breathing  ?Other reaction(s): Hives/Urticaria ?Trouble Breathing  ?Trouble Breathing  ?  ? Antihistamines, Diphenhydramine-Type   ?  Pt does not want to take generic benedryl due to possible "side effects"  ? ? ?Social History:   ?Social History  ? ?Socioeconomic History  ? Marital status: Married  ?  Spouse name: Not on file  ? Number of children: Not on file  ? Years of education: Not on file  ? Highest education level: Not on file  ?Occupational History  ? Not on file  ?Tobacco Use  ? Smoking status: Never  ? Smokeless tobacco: Never  ?Vaping Use  ? Vaping Use: Never used  ?Substance and Sexual Activity  ? Alcohol use: Not Currently  ? Drug use: Not Currently  ? Sexual activity: Yes  ?  Birth control/protection: Surgical  ?Other Topics Concern  ? Not on file  ?Social History Narrative  ? Not on file  ? ?Social Determinants of Health  ? ?Financial Resource Strain: Not on file  ?Food Insecurity: Not on file  ?Transportation Needs: Not on file  ?Physical Activity: Not on file  ?Stress: Not on file  ?Social Connections: Not on file  ?Intimate Partner Violence: Not on file  ?  ?Family History:   ? ?Family History  ?Problem Relation Age of Onset  ? Diabetes Mother   ? Heart disease Mother   ? Emphysema Mother   ? Heart disease Father   ? Diabetes  Father   ?  ? ?ROS:  ?Please see the history of present illness.  ? ?All other ROS reviewed and negative.    ? ?Physical Exam/Data:  ? ?Vitals:  ? 08/01/21 1200 08/01/21 1310 08/01/21 1330 08/01/21 1400  ?BP: (!) 192/88 (!) 169/96 (!) 161/83 (!) 198/94  ?Pulse: 78 76 78 74  ?Resp: 18 (!) 22 15 15   ?Temp:      ?TempSrc:      ?SpO2: 98% 97% 98% 97%  ?Weight:      ?Height:      ? ?No intake or output data in the 24 hours ending 08/01/21 1713 ? ?  08/01/2021  ? 10:46 AM 12/21/2020  ?  8:53 AM 10/13/2020  ?  2:01 PM  ?Last 3 Weights  ?Weight (lbs) 227 lb 8.2 oz 227 lb 9.6 oz 226 lb 12.8 oz  ?Weight (kg) 103.2 kg 103.239 kg 102.876 kg  ?   ?Body mass index is 40.3 kg/m?.  ?General:  Well nourished, well developed, in no acute distress ?HEENT: normal ?Neck: no JVD ?Vascular: No carotid bruits; Distal pulses 2+ bilaterally ?Cardiac:  normal S1, S2; RRR; no murmur  ?Lungs:  clear to auscultation bilaterally, no wheezing, rhonchi or rales  ?Abd: soft, nontender, no hepatomegaly  ?Ext: no edema ?Musculoskeletal:  No deformities, BUE and BLE strength normal and equal ?Skin: warm and dry  ?Neuro:  CNs 2-12 intact, no focal abnormalities noted ?Psych:  Normal affect  ? ?EKG:  The EKG was personally reviewed and demonstrates:  sinus rhythm with HR 82 ?Telemetry:  Telemetry was personally reviewed and demonstrates:  sinus rhythm 70s with intermittent increases in rate consistent with sinus arrhythmia ? ?Relevant CV Studies: ? ?Echo 10/19/20: ? 1. Left ventricular ejection fraction, by estimation, is 60 to 65%. The  ?left ventricle has normal function. The left ventricle has no regional  ?wall motion abnormalities. There is moderate concentric left ventricular  ?hypertrophy. Left ventricular  ?diastolic parameters were normal.  ? 2. Right ventricular systolic function is normal. The right ventricular  ?size is normal.  ? 3. The mitral valve is normal in structure. Trivial mitral valve  ?regurgitation. Moderate mitral annular  calcification.  ? 4. The aortic valve is tricuspid. Aortic valve regurgitation is not  ?visualized. No aortic stenosis is present.  ? 5. The inferior vena cava is normal in size with greater than 50%  ?respiratory varia

## 2021-08-01 NOTE — Assessment & Plan Note (Addendum)
61 year old female with hx of HTN, HLD, obesity, isolated event of afib, prediabetes and strong family history of heart disease including MI in her sister at age 68 who presents with chest pain ?-obs to tele ?-appears more atypical in nature  ?-heart score moderate risk ?-troponin wnl x2, ekg nonspecific changes, continues to have chest pain ?-cardiology consulted for possible stress test ?-echo ordered ?-received 324mg  ASA in ED today, continue low dose.  ?-NG prn ?-norco for pain (unsure if some fibro/costochondritis element as well-nsaid on d/c if chest pain wu negative)  ?-a1c/lipid panel in AM  ?-d-dimer .3, age adjusted no elevated.  ?

## 2021-08-01 NOTE — ED Notes (Signed)
Pt requesting food. Pt given [place] sandwich and a drink.  ?

## 2021-08-01 NOTE — Assessment & Plan Note (Signed)
Mag wnl ?repleted in ED ?Trend in AM  ?

## 2021-08-01 NOTE — ED Triage Notes (Signed)
Pt presents with constant central chest pain since last night; pt states she thought it was indigestion and tried to take her seroquel to sleep but was unable. ?

## 2021-08-01 NOTE — ED Provider Notes (Addendum)
?[facility] ? ? ? ?CSN: [id number] ?Arrival date & time: 08/01/21  0825 ? ? ?  ? ?History   ?Chief Complaint ?Chief Complaint  ?Patient presents with  ? Chest Pain  ? ? ?HPI ?Allison Potts is a 61 y.o. female.  ? ?Patient presents with chest pain that started suddenly last night.  She reports initially, she thought it was indigestion, however she did not eat a meal late before bedtime.  She tried to sleep it off and this did not help.  She denies shortness of breath, sweating, vision changes, dizziness/lightheadedness.  She reports the pain is in the middle of her chest and is sharp. ? ? ?Past Medical History:  ?Diagnosis Date  ? Arthritis   ? Atrial fibrillation (HCC)   ? Fibromyalgia   ? Mental disorder   ? PID (acute pelvic inflammatory disease)   ? her 73s  ? Thyroid disease   ? 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  ? ? ?There are no problems to display for this patient. ? ? ?Past Surgical History:  ?Procedure Laterality Date  ? APPENDECTOMY    ? THYROID LOBECTOMY    ? TUBAL LIGATION    ? ? ?OB History   ? ? Gravida  ?6  ? Para  ?4  ? Term  ?4  ? Preterm  ?   ? AB  ?2  ? Living  ?4  ?  ? ? SAB  ?1  ? IAB  ?1  ? Ectopic  ?   ? Multiple  ?   ? Live Births  ?4  ?   ?  ? Obstetric Comments  ?Vaginal delivery x 4  ?  ? ?  ? ? ? ?Home Medications   ? ?Prior to Admission medications   ?Medication Sig Start Date End Date Taking? Authorizing Provider  ?amphetamine-dextroamphetamine (ADDERALL XR) 10 MG 24 hr capsule Take 10 mg by mouth daily. Patient current had medication increase to 20 mg    [provider]  ?buPROPion (WELLBUTRIN SR) 150 MG 12 hr tablet Take 150 mg by mouth 2 (two) times daily. 07/07/20   [provider]  ?gabapentin (NEURONTIN) 300 MG capsule  10/18/20   [provider]  ?losartan (COZAAR) 50 MG tablet Take 1 tablet by mouth daily. 07/12/20   [provider]  ?methocarbamol (ROBAXIN) 500 MG tablet Take 1 tablet (500 mg total) by mouth at bedtime as needed for  muscle spasms. 06/30/21   Merrilee Jansky, MD  ?OVER THE COUNTER MEDICATION Vitamin C-Take 1 tablet daily.    [provider]  ?OVER THE COUNTER MEDICATION MVI-Take 1 by mouth daily.    [provider]  ? ? ?Family History ?Family History  ?Problem Relation Age of Onset  ? Diabetes Mother   ? Heart disease Mother   ? Emphysema Mother   ? Heart disease Father   ? Diabetes Father   ? ? ?Social History ?Social History  ? ?Tobacco Use  ? Smoking status: Never  ? Smokeless tobacco: Never  ?Vaping Use  ? Vaping Use: Never used  ?Substance Use Topics  ? Alcohol use: Not Currently  ? Drug use: Not Currently  ? ? ? ?Allergies   ?Penicillins ? ? ?Review of Systems ?Review of Systems ?Per HPI ? ?Physical Exam ?Triage Vital Signs ?ED Triage Vitals  ?Enc Vitals Group  ?   BP 08/01/21 0831 (!) 182/80  ?   Pulse Rate 08/01/21 0831 84  ?   Resp  08/01/21 0831 18  ?   Temp --   ?   Temp src --   ?   SpO2 08/01/21 0831 100 %  ?   Weight --   ?   Height --   ?   Head Circumference --   ?   Peak Flow --   ?   Pain Score 08/01/21 0833 7  ?   Pain Loc --   ?   Pain Edu? --   ?   Excl. in GC? --   ? ?No data found. ? ?Updated Vital Signs ?BP (!) 182/80 (BP Location: Right Arm)   Pulse 84   Resp 18   SpO2 100%  ? ?Visual Acuity ?Right Eye Distance:   ?Left Eye Distance:   ?Bilateral Distance:   ? ?Right Eye Near:   ?Left Eye Near:    ?Bilateral Near:    ? ?Physical Exam ?Vitals and nursing note reviewed.  ?Constitutional:   ?   General: She is not in acute distress. ?   Appearance: She is not toxic-appearing.  ?   Comments: Uncomfortable-appearing  ?Cardiovascular:  ?   Rate and Rhythm: Normal rate and regular rhythm.  ?Pulmonary:  ?   Effort: Pulmonary effort is normal. No respiratory distress.  ?Chest:  ?   Chest wall: No mass or tenderness.  ?Skin: ?   General: Skin is warm and dry.  ?   Capillary Refill: Capillary refill takes less than 2 seconds.  ?   Coloration: Skin is not cyanotic or pale.  ?   Findings: No  erythema.  ?Neurological:  ?   Mental Status: She is alert and oriented to person, place, and time.  ? ? ? ?UC Treatments / Results  ?Labs ?(all labs ordered are listed, but only abnormal results are displayed) ?Labs Reviewed - No data to display ? ?EKG ? ? ?Radiology ?No results found. ? ?Procedures ?Procedures (including critical care time) ? ?Medications Ordered in UC ?Medications - No data to display ? ?Initial Impression / Assessment and Plan / UC Course  ?I have reviewed the triage vital signs and the nursing notes. ? ?Pertinent labs & imaging results that were available during my care of the patient were reviewed by me and considered in my medical decision making (see chart for details). ? ?  ?EKG today shows normal sinus rhythm, however there is nonspecific ST and T wave abnormality.  The EKG is changed compared with previous EKG about 1 year ago.  Given the active chest pain and change in EKG, I recommended patient immediately be seen in the emergency room for further evaluation and management.  She has risk factors including hypertension and prediabetes.  She declines transportation via EMS and will go by private vehicle and I feel at this time, that is appropriate.  The patient was given the opportunity to ask questions.  All questions answered to their satisfaction.  The patient is in agreement to this plan.  ? ?Final Clinical Impressions(s) / UC Diagnoses  ? ?Final diagnoses:  ?Chest pain, unspecified type  ?Abnormal finding on EKG  ? ? ? ?Discharge Instructions   ? ?  ?- Please go straight to the Emergency Room for further evaluation and management of your chest pain ? ? ? ? ?ED Prescriptions   ?None ?  ? ?PDMP not reviewed this encounter. ?  ?Valentino Nose, NP ?08/01/21 0848 ? ?  ?Valentino Nose, NP ?08/01/21 [phone] ? ?

## 2021-08-01 NOTE — Assessment & Plan Note (Addendum)
Poorly controlled while in ED-anxious and in pain  ?Continue hydralazine prn, start back her wellbutrin/control pain  ?Start back home medication of losartan 100mg  daily  ?

## 2021-08-01 NOTE — ED Triage Notes (Signed)
Patient here for evaluation of chest pain that started last night and got worse this morning. Describes pain at 5/10 in the center of the chest that feels like she is being punched, denies radiation of pain, pain is exacerbated by taking deep breaths. Denies shortness of breath, dizziness, and nausea. History of hypertension, denies missing any doses and takes her antihypertensives at night. Patient is alert and oriented at this time. ?

## 2021-08-01 NOTE — ED Notes (Signed)
Patient is being discharged from the Urgent Care and sent to the Emergency Department via personal vehicle . Per Provider Cathlean Marseilles, patient is in need of higher level of care due to needing a cardiac workup. Patient is aware and verbalizes understanding of plan of care.  ? ?Vitals:  ? 08/01/21 0831  ?BP: (!) 182/80  ?Pulse: 84  ?Resp: 18  ?SpO2: 100%  ?  ?

## 2021-08-01 NOTE — Assessment & Plan Note (Signed)
Noted  

## 2021-08-01 NOTE — Discharge Instructions (Addendum)
-   Please go straight to the Emergency Room for further evaluation and management of your chest pain ?

## 2021-08-01 NOTE — H&P (Addendum)
?History and Physical  ? ? ?Patient: Allison Potts [id number] DOB: May 07, 1960 ?DOA: 08/01/2021 ?DOS: the patient was seen and examined on 08/01/2021 ?PCP: Lorene Dy, MD  ?Patient coming from:  [facility] urgent care  - lives with her husband  ? ? ?Chief Complaint: chest pain  ? ?HPI: Allison Potts is a 61 y.o. female with medical history significant of atrial fibrillation on no anticoagulation, thyroid cancer s/p right thyroidectomy, OSA, depression and anxiety, fibromylagia who presents with chest pain that started last night around 11:30pm. Pain was epigastric and she initially thought it was indigestion. When she woke up in the AM she still had the pain. The pain would come and go and progressively got worse. Pain was substernal this Am and described as "a lot of pressure", no radiation, associated shortness of breath. Pain goes from 5-7/10. Pain rated as a 5/10. No N/V or diaphoresis. Exertion doesn't seem to make it worse. Nothing makes it better or worse.  ? ?Family history of deadly MI in her sister at age 17 years of age. Father hx of 2 MI and CABG.  ? ?She does not smoke or drink.  ? ? ?she has been feeling good prior to chest pain.  Denies any recent illness. Denies any fever/chills, vision changes/headaches, no palpitations, shortness of breath or cough, abdominal pain, N/V/D, dysuria or leg swelling.  ? ? ? ?ER Course:  vitals: afebrile, bp: 201/108, HR: 89, RR: 18, oxygen: 97%RA ?Pertinent labs: potassium 3.2, troponin wnlx 2, d-dimer: .52,  ?CXR: cardiomegaly. No acute findings.  ?In ED: given ASA, NG, ativan, potassium and hydralazine.  ? ? ?Review of Systems: As mentioned in the history of present illness. All other systems reviewed and are negative. ?Past Medical History:  ?Diagnosis Date  ? Arthritis   ? Atrial fibrillation ([place])   ? Fibromyalgia   ? Mental disorder   ? PID (acute pelvic inflammatory disease)   ? her 61s  ? Thyroid disease   ? 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  ? ?Past Surgical  History:  ?Procedure Laterality Date  ? APPENDECTOMY    ? THYROID LOBECTOMY    ? TUBAL LIGATION    ? ?Social History:  reports that she has never smoked. She has never used smokeless tobacco. She reports that she does not currently use alcohol. She reports that she does not currently use drugs. ? ?Allergies  ?Allergen Reactions  ? Penicillins Hives, Anaphylaxis and Rash  ?  Passing out, lumps and bumps, was given epi ?Other reaction(s): Hives/Urticaria ?Trouble Breathing  ?Other reaction(s): Hives/Urticaria ?Trouble Breathing  ?Trouble Breathing  ?  ? Antihistamines, Diphenhydramine-Type   ?  Pt does not want to take generic benedryl due to possible "side effects"  ? ? ?Family History  ?Problem Relation Age of Onset  ? Diabetes Mother   ? Heart disease Mother   ? Emphysema Mother   ? Heart disease Father   ? Diabetes Father   ? ? ?Prior to Admission medications   ?Medication Sig Start Date End Date Taking? Authorizing Provider  ?buPROPion (WELLBUTRIN XL) 300 MG 24 hr tablet Take 300 mg by mouth daily. 07/24/21  Yes [provider]  ?fluticasone (FLONASE) 50 MCG/ACT nasal spray Place 2 sprays into both nostrils daily as needed for allergies.   Yes [provider]  ?gabapentin (NEURONTIN) 300 MG capsule Take 300 mg by mouth 2 (two) times daily. 10/18/20  Yes [provider]  ?losartan (COZAAR) 100 MG tablet Take 100 mg by mouth daily.  06/03/21  Yes [provider]  ?metFORMIN (GLUCOPHAGE-XR) 750 MG 24 hr tablet Take 750 mg by mouth every morning. 05/30/21  Yes [provider]  ?Multiple Vitamins-Minerals (MULTIVITAMIN ADULT) CHEW Chew 1 each by mouth daily.   Yes [provider]  ?naproxen sodium (ALEVE) 220 MG tablet Take 660 mg by mouth daily as needed (pain).   Yes [provider]  ?QUEtiapine (SEROQUEL) 50 MG tablet Take 50 mg by mouth at bedtime. 07/10/21   [provider]  ? ? ?Physical Exam: ?Vitals:  ? 08/01/21 1310 08/01/21 1330 08/01/21 1400  08/01/21 1701  ?BP: (!) 169/96 (!) 161/83 (!) 198/94 (!) 154/93  ?Pulse: 76 78 74 84  ?Resp: (!) 22 15 15 19   ?Temp:      ?TempSrc:      ?SpO2: 97% 98% 97% 96%  ?Weight:      ?Height:      ? ?General:  Appears calm and comfortable and is in NAD ?Eyes:  PERRL, EOMI, normal lids, iris ?ENT:  grossly normal hearing, lips & tongue, mmm; appropriate dentition ?Neck:  no LAD, masses or thyromegaly; no carotid bruits ?Cardiovascular:  RRR, no m/r/g. No LE edema.  ?Respiratory:   CTA bilaterally with no wheezes/rales/rhonchi.  Normal respiratory effort. ?Abdomen:  soft, NT, ND, NABS ?Back:   normal alignment, no CVAT ?Skin:  no rash or induration seen on limited exam ?Musculoskeletal:  grossly normal tone BUE/BLE, good ROM, no bony abnormality. Reproducible pain over sternum (different from chest pain she is experiencing)  ?Lower extremity:  No LE edema.  Limited foot exam with no ulcerations.  2+ distal pulses. ?Psychiatric:  grossly normal mood and affect, speech fluent and appropriate, AOx3 ?Neurologic:  CN 2-12 grossly intact, moves all extremities in coordinated fashion, sensation intact ? ? ?Radiological Exams on Admission: ?Independently reviewed - see discussion in A/P where applicable ? ?DG Chest 2 View ? ?Result Date: 08/01/2021 ?CLINICAL DATA:  Chest pain EXAM: CHEST - 2 VIEW COMPARISON:  None FINDINGS: Transverse diameter of heart is slightly increased. There are no signs of pulmonary edema or focal pulmonary consolidation. There is no pleural effusion or pneumothorax IMPRESSION: Cardiomegaly. There are no signs of pulmonary edema or focal pulmonary consolidation. Electronically Signed   By: Elmer Picker M.D.   On: 08/01/2021 09:34   ? ?EKG: Independently reviewed.  NSR with rate 82; nonspecific ST changes with no evidence of acute ischemia ? ? ?Labs on Admission: I have personally reviewed the available labs and imaging studies at the time of the admission. ? ?Pertinent labs:   ?potassium 3.2,  ?troponin  wnlx 2, ? d-dimer: .52,  ? ? ?Assessment and Plan: ?* Chest pain ?61 year old female with hx of HTN, HLD, obesity, isolated event of afib, prediabetes and strong family history of heart disease including MI in her sister at age 60 who presents with chest pain ?-obs to tele ?-appears more atypical in nature  ?-heart score moderate risk ?-troponin wnl x2, ekg nonspecific changes, continues to have chest pain ?-cardiology consulted for possible stress test ?-echo ordered ?-received 324mg  ASA in ED today, continue low dose.  ?-NG prn ?-norco for pain (unsure if some fibro/costochondritis element as well-nsaid on d/c if chest pain wu negative)  ?-a1c/lipid panel in AM  ?-d-dimer .46, age adjusted no elevated.  ? ?Hypokalemia ?Mag wnl ?repleted in ED ?Trend in AM  ? ?Atrial fibrillation ([place]) ?single episode of A. fib in June 2021, went to ED in [place] and converted  to sinus rhythm spontaneously. CHA2DS2-VASc 2 (hypertension, female), she was not started on anticoagulation ?Last seen by cardiology 09/2020. No recurrence or complaints.  ?Continue telemetry  ? ?Primary hypertension ?Poorly controlled while in ED-anxious and in pain  ?Continue hydralazine prn, start back her wellbutrin/control pain  ?Start back home medication of losartan 100mg  daily  ? ?Fibromyalgia ?Continue home medication of gabapentin ?? If some fibro/costochondritis contributing as well ?norco for severe pain ? ? ?Prediabetes ?A1c: 6.4 04/2021 ?Hold metformin while inpatient ?Repeat a1c in AM  ? ?Obstructive sleep apnea (adult) (pediatric) ?Recent sleep study confirms OSA-AHI of 23.3 and severe oxygen desaturation  ?Waiting for cpap, but sleeps better with it on ?Ordered qhs while here  ? ?Refusal of blood transfusions as patient is [employer] ?Noted  ? ? ? ?Advance Care Planning:   Code Status: Full Code  ? ?Consults: cardiology  ? ?DVT Prophylaxis: lovenox  ? ?Family Communication: none  ? ?Severity of Illness: ?The appropriate patient  status for this patient is OBSERVATION. Observation status is judged to be reasonable and necessary in order to provide the required intensity of service to ensure the patient's safety. The patient's presenting

## 2021-08-01 NOTE — Assessment & Plan Note (Signed)
A1c: 6.4 04/2021 ?Hold metformin while inpatient ?Repeat a1c in AM  ?

## 2021-08-01 NOTE — Progress Notes (Signed)
Pt set up on CPAP with full face mask tolerating well. ?

## 2021-08-01 NOTE — Assessment & Plan Note (Signed)
single episode of A. fib in June 2021, went to ED in [place] and converted to sinus rhythm spontaneously. CHA2DS2-VASc 2 (hypertension, female), she was not started on anticoagulation ?Last seen by cardiology 09/2020. No recurrence or complaints.  ?Continue telemetry  ?

## 2021-08-01 NOTE — Assessment & Plan Note (Addendum)
Recent sleep study confirms OSA-AHI of 23.3 and severe oxygen desaturation  ?Waiting for cpap, but sleeps better with it on ?Ordered qhs while here  ?

## 2021-08-01 NOTE — Assessment & Plan Note (Signed)
Continue home medication of gabapentin ?? If some fibro/costochondritis contributing as well ?norco for severe pain ? ?

## 2021-08-01 NOTE — ED Provider Notes (Signed)
?[facility] ?Provider Note ? ? ?CSN: [id number] ?Arrival date & time: 08/01/21  [id number] ? ?  ? ?History ? ?Chief Complaint  ?Patient presents with  ? Chest Pain  ? ? ?Allison Potts is a 61 y.o. female. ? ? ?Chest Pain ? ?HPI: A 61 year old patient with a history of hypertension and obesity presents for evaluation of chest pain. Initial onset of pain was more than 6 hours ago. The patient's chest pain is described as heaviness/pressure/tightness and is not worse with exertion. The patient's chest pain is middle- or left-sided, is not well-localized, is not sharp and does not radiate to the arms/jaw/neck. The patient does not complain of nausea and denies diaphoresis. The patient has a family history of coronary artery disease in a first-degree relative with onset less than age 87. The patient has no history of stroke, has no history of peripheral artery disease, has not smoked in the past 90 days, denies any history of treated diabetes and has no history of hypercholesterolemia.  ? ?61 year old female with a hx of fibromyalgia, depression, atrial fibrillation not on [facility], who presents to the ED with chest pain. Pain started last night before she went to bed. Sx initially thought to be GERD while lying flat. Better when sitting up. Pressure like sensation. Substernal, no radiation. No fevers, chills, or cough. No leg swelling. ? ?Home Medications ?Prior to Admission medications   ?Medication Sig Start Date End Date Taking? Authorizing Provider  ?buPROPion (WELLBUTRIN XL) 300 MG 24 hr tablet Take 300 mg by mouth daily. 07/24/21  Yes [provider]  ?fluticasone (FLONASE) 50 MCG/ACT nasal spray Place 2 sprays into both nostrils daily as needed for allergies.   Yes [provider]  ?gabapentin (NEURONTIN) 300 MG capsule Take 300 mg by mouth 2 (two) times daily. 10/18/20  Yes [provider]  ?losartan (COZAAR) 100 MG tablet Take 100 mg by mouth daily. 06/03/21  Yes  [provider]  ?metFORMIN (GLUCOPHAGE-XR) 750 MG 24 hr tablet Take 750 mg by mouth every morning. 05/30/21  Yes [provider]  ?Multiple Vitamins-Minerals (MULTIVITAMIN ADULT) CHEW Chew 1 each by mouth daily.   Yes [provider]  ?naproxen sodium (ALEVE) 220 MG tablet Take 660 mg by mouth daily as needed (pain).   Yes [provider]  ?QUEtiapine (SEROQUEL) 50 MG tablet Take 50 mg by mouth at bedtime. 07/10/21   [provider]  ?   ? ?Allergies    ?Penicillins and Antihistamines, diphenhydramine-type   ? ?Review of Systems   ?Review of Systems  ?Cardiovascular:  Positive for chest pain.  ?All other systems reviewed and are negative. ? ?Physical Exam ?Updated Vital Signs ?BP (!) 161/83   Pulse 78   Temp 98.3 ?F (36.8 ?C) (Oral)   Resp 15   Ht 5\' 3"  (1.6 m)   Wt 103.2 kg   SpO2 98%   BMI 40.30 kg/m?  ?Physical Exam ?Vitals and nursing note reviewed.  ?Constitutional:   ?   General: She is not in acute distress. ?   Appearance: She is well-developed. She is obese.  ?HENT:  ?   Head: Normocephalic and atraumatic.  ?Eyes:  ?   Conjunctiva/sclera: Conjunctivae normal.  ?Neck:  ?   Vascular: No JVD.  ?Cardiovascular:  ?   Rate and Rhythm: Normal rate and regular rhythm.  ?   Heart sounds: No murmur heard. ?Pulmonary:  ?   Effort: Pulmonary effort is normal. No respiratory  distress.  ?   Breath sounds: Normal breath sounds.  ?Abdominal:  ?   Palpations: Abdomen is soft.  ?   Tenderness: There is no abdominal tenderness.  ?Musculoskeletal:     ?   General: No swelling.  ?   Cervical back: Neck supple.  ?   Right lower leg: No edema.  ?   Left lower leg: No edema.  ?Skin: ?   General: Skin is warm and dry.  ?   Capillary Refill: Capillary refill takes less than 2 seconds.  ?Neurological:  ?   Mental Status: She is alert.  ?Psychiatric:     ?   Mood and Affect: Mood normal.  ? ? ?ED Results / Procedures / Treatments   ?Labs ?(all labs ordered are listed, but only  abnormal results are displayed) ?Labs Reviewed  ?BASIC METABOLIC PANEL - Abnormal; Notable for the following components:  ?    Result Value  ? Potassium 3.2 (*)   ? Calcium 8.7 (*)   ? All other components within normal limits  ?D-DIMER, QUANTITATIVE - Abnormal; Notable for the following components:  ? D-Dimer, Quant 0.52 (*)   ? All other components within normal limits  ?CBC  ?MAGNESIUM  ?BRAIN NATRIURETIC PEPTIDE  ?TROPONIN I (HIGH SENSITIVITY)  ?TROPONIN I (HIGH SENSITIVITY)  ? ? ?EKG ?None ? ?Radiology ?DG Chest 2 View ? ?Result Date: 08/01/2021 ?CLINICAL DATA:  Chest pain EXAM: CHEST - 2 VIEW COMPARISON:  None FINDINGS: Transverse diameter of heart is slightly increased. There are no signs of pulmonary edema or focal pulmonary consolidation. There is no pleural effusion or pneumothorax IMPRESSION: Cardiomegaly. There are no signs of pulmonary edema or focal pulmonary consolidation. Electronically Signed   By: Elmer Picker M.D.   On: 08/01/2021 09:34   ? ?Procedures ?Procedures  ? ? ?Medications Ordered in ED ?Medications  ?nitroGLYCERIN (NITROSTAT) SL tablet 0.4 mg (has no administration in time range)  ?aspirin tablet 325 mg (325 mg Oral Given 08/01/21 1139)  ?potassium chloride SA (KLOR-CON M) CR tablet 40 mEq (40 mEq Oral Given 08/01/21 1139)  ?alum & mag hydroxide-simeth (MAALOX/MYLANTA) 200-200-20 MG/5ML suspension 30 mL (30 mLs Oral Given 08/01/21 1140)  ?  And  ?lidocaine (XYLOCAINE) 2 % viscous mouth solution 15 mL (15 mLs Oral Given 08/01/21 1140)  ? ? ?ED Course/ Medical Decision Making/ A&P ?  ?HEAR Score: 5                       ?Medical Decision Making ?Amount and/or Complexity of Data Reviewed ?Labs: ordered. ?Radiology: ordered. ? ?Risk ?OTC drugs. ?Prescription drug management. ?Decision regarding hospitalization. ? ? ?61 year old female with a hx of fibromyalgia, depression, atrial fibrillation not on [facility], who presents to the ED with chest pain. Pain started last night before she went to bed. Sx  initially thought to be GERD while lying flat. Better when sitting up. Pressure like sensation. Substernal, no radiation. No fevers, chills, or cough. No leg swelling. ? ?Vitals and telemetry on arrival: Afebrile, not tachycardic or tachypneic, hypertensive BP 201/108, saturating 97% on room air.  Sinus rhythm noted on cardiac telemetry ? ?Pertinent exam findings include: Lungs clear to auscultation bilaterally, overall well-appearing, no lower extremity edema or JVD, heart sounds normal. ? ?Differential diagnosis includes: ACS, pneumonia, pneumothorax, pulmonary embolism,pericarditis/myocarditis, GERD, PUD, musculoskeletal. HEART score of 5, moderate risk. Patient given ASA 325 mg, offered but decline nitroglycerin. ? ?Well's score, low probability. D-dimer negative for age adjustment.  Labs unremarkable.  Unlikely pneumonia, no cough, no leukocytosis, no fevers, CXR and exam without acute findings. Unlikely pneumothorax, no findings on  CXR. Unlikely pericarditis/myocarditis, does not fit clinical picture. Chest pain not exertional. Unlikely dissection, no pulse deficit, no tearing chest pain, no neurologic complaints. Due to patient's high risk will rule out ACS. ? ?EKG: ?Normal sinus rhythm with a rate of 79, nonspecific ST segment changes present, no abnormal intervals, or dysrhythmia. ? ?Lab results include: Troponins x2 negative, D-dimer negative after age adjustment, CBC without a leukocytosis or anemia, BMP with mild hypokalemia to 3.2, otherwise unremarkable, BNP normal, magnesium normal. ? ?Imaging results include: Chest x-ray reviewed by myself and radiology negative for active disease.  Cardiomegaly was noted. ? ?Course of tx has consisted of: Patient administered aspirin, Maalox and viscous lidocaine, potassium tablet ? ?On reassessment, the patient continued to endorse persistent chest pressure at rest.  Discussed the patient's symptoms and work-up with her bedside.  Her hear score is a 5 indicating  moderate risk.  She is intermittently hypertensive without evidence of hypertensive emergency.  She has not followed with a cardiologist outpatient and has not had a stress test or cardiac work-up in some time.  Discu

## 2021-08-02 ENCOUNTER — Observation Stay (HOSPITAL_BASED_OUTPATIENT_CLINIC_OR_DEPARTMENT_OTHER): Payer: Medicare (Managed Care)

## 2021-08-02 ENCOUNTER — Other Ambulatory Visit (HOSPITAL_COMMUNITY): Payer: Self-pay

## 2021-08-02 ENCOUNTER — Observation Stay (HOSPITAL_COMMUNITY): Payer: Medicare (Managed Care)

## 2021-08-02 DIAGNOSIS — I10 Essential (primary) hypertension: Secondary | ICD-10-CM | POA: Diagnosis not present

## 2021-08-02 DIAGNOSIS — M797 Fibromyalgia: Secondary | ICD-10-CM | POA: Diagnosis not present

## 2021-08-02 DIAGNOSIS — R079 Chest pain, unspecified: Secondary | ICD-10-CM | POA: Diagnosis not present

## 2021-08-02 DIAGNOSIS — R0789 Other chest pain: Secondary | ICD-10-CM | POA: Diagnosis not present

## 2021-08-02 DIAGNOSIS — I700 Atherosclerosis of aorta: Secondary | ICD-10-CM | POA: Diagnosis not present

## 2021-08-02 DIAGNOSIS — G4733 Obstructive sleep apnea (adult) (pediatric): Secondary | ICD-10-CM

## 2021-08-02 DIAGNOSIS — I4891 Unspecified atrial fibrillation: Secondary | ICD-10-CM | POA: Diagnosis not present

## 2021-08-02 LAB — BASIC METABOLIC PANEL
Anion gap: 5 (ref 5–15)
BUN: 14 mg/dL (ref 8–23)
CO2: 24 mmol/L (ref 22–32)
Calcium: 8.3 mg/dL — ABNORMAL LOW (ref 8.9–10.3)
Chloride: 108 mmol/L (ref 98–111)
Creatinine, Ser: 0.55 mg/dL (ref 0.44–1.00)
GFR, Estimated: >60 mL/min (ref >60)
Glucose, Bld: 116 mg/dL — ABNORMAL HIGH (ref 70–99)
Potassium: 3.3 mmol/L — ABNORMAL LOW (ref 3.5–5.1)
Sodium: 137 mmol/L (ref 135–145)

## 2021-08-02 LAB — ECHOCARDIOGRAM COMPLETE
AR max vel: 1.98 cm2
AV Peak grad: 11.6 mmHg
Ao pk vel: 1.7 m/s
Area-P 1/2: 2.46 cm2
Calc EF: 62.8 %
Height: 63 in
S' Lateral: 3 cm
Single Plane A2C EF: 59.5 %
Single Plane A4C EF: 65.8 %
Weight: 3456 oz

## 2021-08-02 LAB — LIPID PANEL
Cholesterol: 249 mg/dL — ABNORMAL HIGH (ref 0–200)
HDL: 49 mg/dL (ref >40)
LDL Cholesterol: 139 mg/dL — ABNORMAL HIGH (ref 0–99)
Total CHOL/HDL Ratio: 5.1 RATIO
Triglycerides: 304 mg/dL — ABNORMAL HIGH (ref <150)
VLDL: 61 mg/dL — ABNORMAL HIGH (ref 0–40)

## 2021-08-02 LAB — CBC
HCT: 38.4 % (ref 36.0–46.0)
Hemoglobin: 12.9 g/dL (ref 12.0–15.0)
MCH: 29.9 pg (ref 26.0–34.0)
MCHC: 33.6 g/dL (ref 30.0–36.0)
MCV: 88.9 fL (ref 80.0–100.0)
Platelets: 271 10*3/uL (ref 150–400)
RBC: 4.32 MIL/uL (ref 3.87–5.11)
RDW: 12.9 % (ref 11.5–15.5)
WBC: 7.2 10*3/uL (ref 4.0–10.5)
nRBC: 0 % (ref 0.0–0.2)

## 2021-08-02 LAB — HIV ANTIBODY (ROUTINE TESTING W REFLEX): HIV Screen 4th Generation wRfx: NONREACTIVE

## 2021-08-02 IMAGING — CT CT HEART MORP W/ CTA COR W/ SCORE W/ CA W/CM &/OR W/O CM
4 of 7 series · 8 of 20 positions shown, 9 images · IV contrast (APPLIED)
Comparison: No prior cardiac CT. CT the abdomen and pelvis
[DATE].
COMPARISON: No prior cardiac CT. CT the abdomen and pelvis
[DATE].

Addendum:
EXAM:
OVER-READ INTERPRETATION  CT CHEST

The following report is an over-read performed by radiologist Dr.
KIKE [REDACTED] on [DATE]. This
over-read does not include interpretation of cardiac or coronary
anatomy or pathology. The coronary calcium score/coronary CTA
interpretation by the cardiologist is attached.
HISTORY: 60 yo female with chest pain/anginal equiv, ECGs and troponins
normal
Cardiac/Coronary CTA
TECHNIQUE: The patient was scanned on a Siemens Force scanner.
PROTOCOL: A 120 kV prospective scan was triggered in the descending thoracic
aorta at 111 HU's. Axial non-contrast 3 mm slices were carried out
through the heart. The data set was analyzed on a dedicated work
station and scored using the Agatson method. Gantry rotation speed
was 250 msecs and collimation was .6 mm. Beta blockade and 0.8 mg of
sl NTG was given. The 3D data set was reconstructed in 5% intervals
of the 35-75 % of the R-R cycle. Diastolic phases were analyzed on a
dedicated work station using MPR, MIP and VRT modes. The patient
received 100mL OMNIPAQUE IOHEXOL 350 MG/ML SOLN of contrast.

[Series 6: best diast 75 % · axial · 0.39mm/px · z∈[-410,-371]mm · 2 of 293 slices shown]
[im 98/293  vessel]
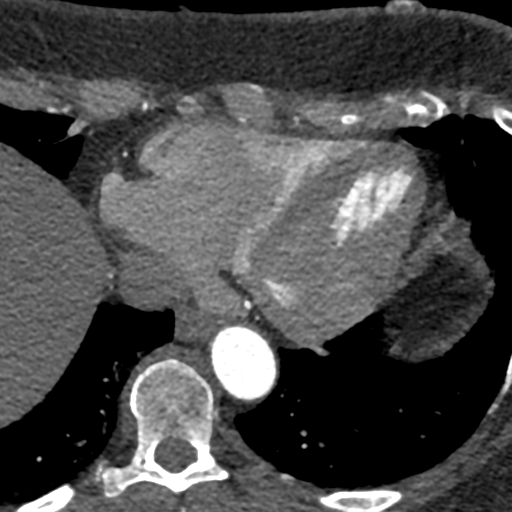
[im 195/293  vessel]
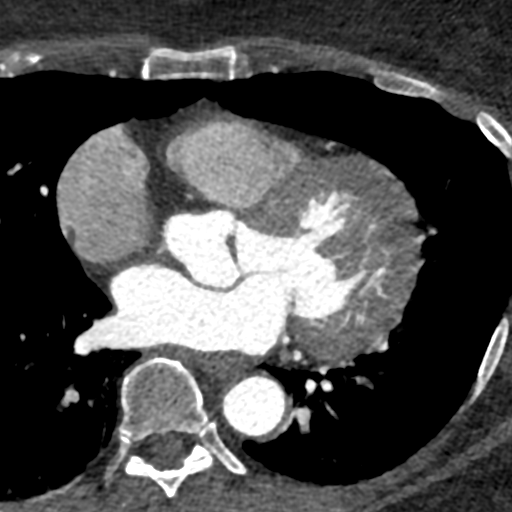

[Series 7: ts diast sharp · axial · 0.39mm/px · z∈[-410,-371]mm · 2 of 293 slices shown]
[im 98/293  lung]
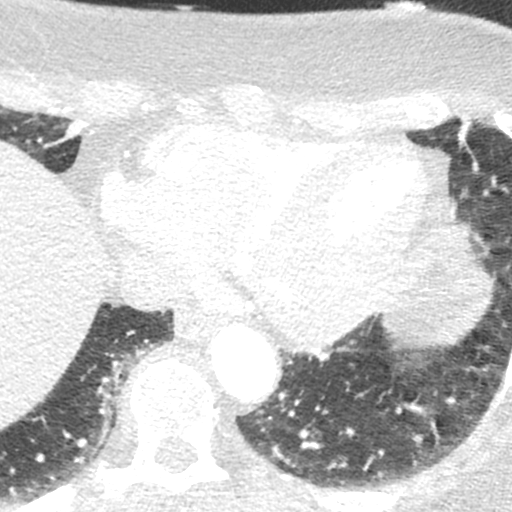
[im 195/293  lung]
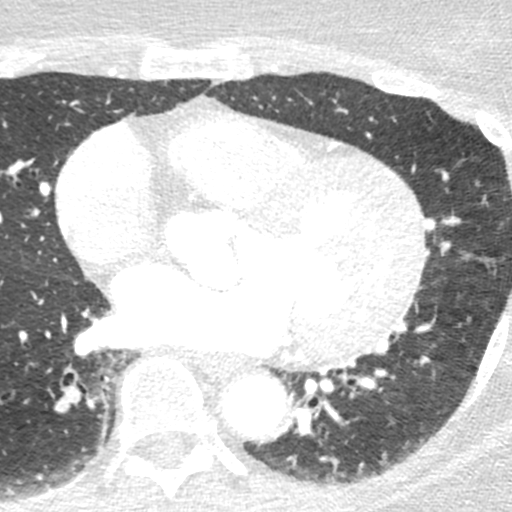

[Series 8: best syst · axial · 0.39mm/px · z∈[-410,-371]mm · 2 of 293 slices shown, 3 images]
[im 98/293  vessel]
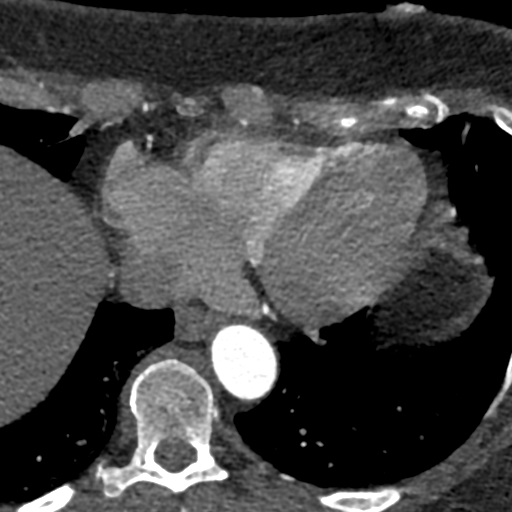
[im 98/293  lung]
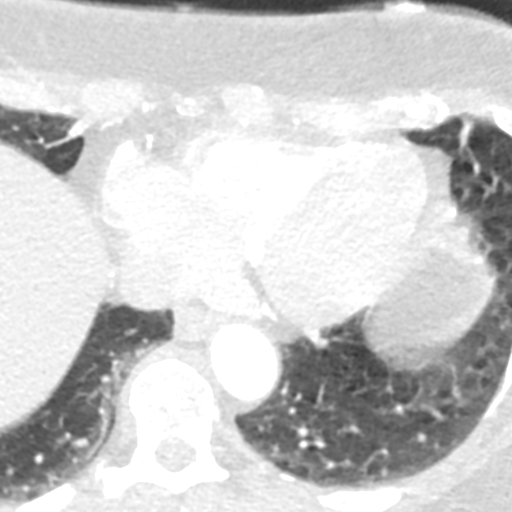
[im 195/293  vessel]
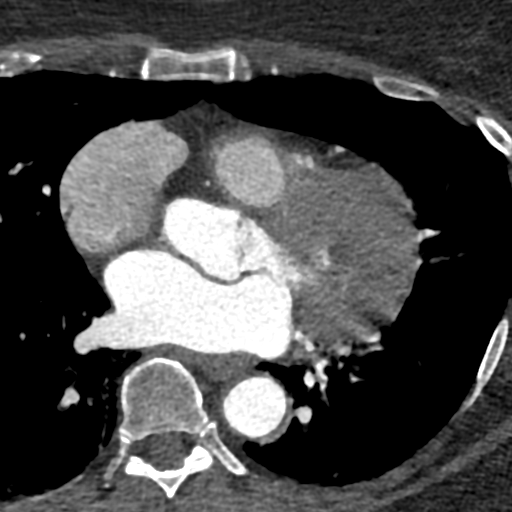

[Series 9: ts syst sharp · axial · 0.39mm/px · z∈[-410,-371]mm · 2 of 293 slices shown]
[im 98/293  lung]
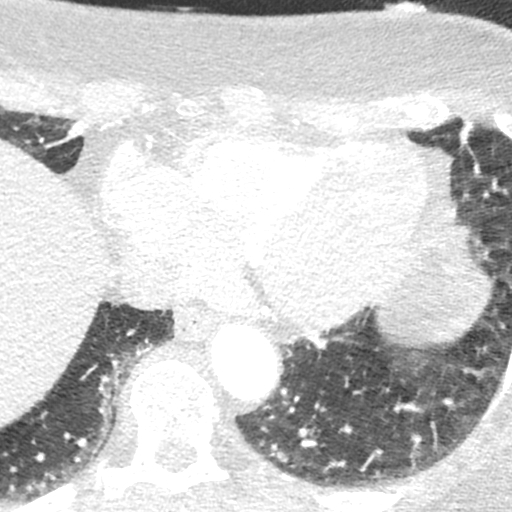
[im 195/293  lung]
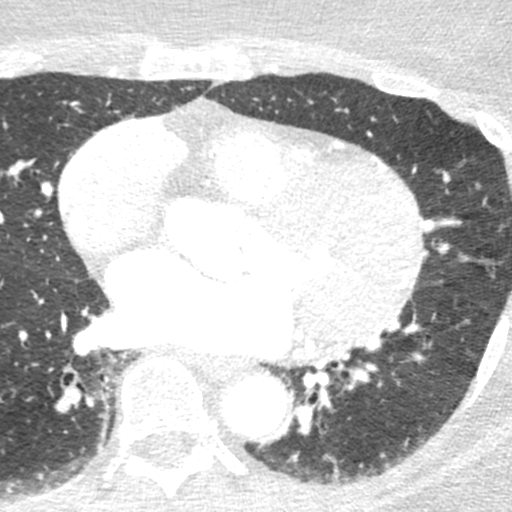

[8 of 20 positions shown; findings below may reference images not displayed]

FINDINGS: In the lateral segment of the right middle lobe (axial image 16 of
series 11) there is a macrolobulated 2.6 x 1.4 cm pulmonary nodule
abutting the major fissure. Within the visualized portions of the
thorax there are no suspicious appearing pulmonary nodules or
masses, there is no acute consolidative airspace disease, no pleural
effusions, no pneumothorax and no lymphadenopathy. Visualized
portions of the upper abdomen are unremarkable. There are no
aggressive appearing lytic or blastic lesions noted in the
visualized portions of the skeleton.
IMPRESSION: 1. 2.6 x 1.4 cm aggressive appearing nodule in the lateral segment
of the right middle lobe, which appears similar to prior CT the
abdomen and pelvis [DATE]. The persistence and appearance of
this nodule is concerning for slow-growing neoplasm such as primary
bronchogenic adenocarcinoma. Further evaluation with PET-CT is
recommended in the near future to better evaluate this finding.

These results will be called to the ordering clinician or
representative by the Radiologist Assistant, and communication
documented in the PACS or [REDACTED].
FINDINGS: Quality: Fair, HR 65, mild misalignment artifact

Coronary calcium score: The patient's coronary artery calcium score
is 0, which places the patient in the 0 percentile.

Coronary arteries: Normal coronary origins.  Left dominance.

Right Coronary Artery: Non-dominant.  No disease.

Left Main Coronary Artery: Normal. Bifurcates into the LAD and LCx
arteries.

Left Anterior Descending Coronary Artery: Large anterior vessel that
reaches the apex. No disease. 2 large diagonal branches without
disease.

Left Circumflex Artery: Dominant. Normal AV groove vessel with
normal L-PDA and L-PLB branches. Large OM branch without disease.

Aorta: Normal size, 28 mm at the mid ascending aorta (level of the
PA bifurcation) measured double oblique. Aortic atherosclerosis. No
dissection.

Aortic Valve: Trileaflet.  No calcifications.

Other findings:

Normal pulmonary vein drainage into the left atrium.

Normal left atrial appendage without a thrombus.

Normal size of the pulmonary artery.

Mitral annular calcification.
IMPRESSION: 1. No evidence of CAD, CADRADS = 0.

2. Coronary calcium score of 0. This was 0 percentile for age and
sex matched control.

3. Normal coronary origin with left dominance.

4. Aortic atherosclerosis.

5. Mitral annular calcification.

6. Consider non-coronary causes of chest pain.

*** End of Addendum ***
EXAM:
OVER-READ INTERPRETATION  CT CHEST

The following report is an over-read performed by radiologist Dr.
KIKE [REDACTED] on [DATE]. This
over-read does not include interpretation of cardiac or coronary
anatomy or pathology. The coronary calcium score/coronary CTA
interpretation by the cardiologist is attached.
FINDINGS: In the lateral segment of the right middle lobe (axial image 16 of
series 11) there is a macrolobulated 2.6 x 1.4 cm pulmonary nodule
abutting the major fissure. Within the visualized portions of the
thorax there are no suspicious appearing pulmonary nodules or
masses, there is no acute consolidative airspace disease, no pleural
effusions, no pneumothorax and no lymphadenopathy. Visualized
portions of the upper abdomen are unremarkable. There are no
aggressive appearing lytic or blastic lesions noted in the
visualized portions of the skeleton.
IMPRESSION: 1. 2.6 x 1.4 cm aggressive appearing nodule in the lateral segment
of the right middle lobe, which appears similar to prior CT the
abdomen and pelvis [DATE]. The persistence and appearance of
this nodule is concerning for slow-growing neoplasm such as primary
bronchogenic adenocarcinoma. Further evaluation with PET-CT is
recommended in the near future to better evaluate this finding.

These results will be called to the ordering clinician or
representative by the Radiologist Assistant, and communication
documented in the PACS or [REDACTED].

## 2021-08-02 MED ORDER — METHOCARBAMOL 500 MG PO TABS: 500.0000 mg | ORAL_TABLET | Freq: Three times a day (TID) | ORAL | Status: DC | PRN

## 2021-08-02 MED ORDER — NITROGLYCERIN 0.4 MG SL SUBL
SUBLINGUAL_TABLET | SUBLINGUAL | Status: AC
  Filled 2021-08-02: qty 2

## 2021-08-02 MED ORDER — ROSUVASTATIN CALCIUM 20 MG PO TABS
20.0000 mg | ORAL_TABLET | Freq: Every day | ORAL | Status: DC
  Administered 2021-08-02: 20 mg via ORAL
  Filled 2021-08-02: qty 1

## 2021-08-02 MED ORDER — POTASSIUM CHLORIDE CRYS ER 20 MEQ PO TBCR
40.0000 meq | EXTENDED_RELEASE_TABLET | ORAL | Status: DC
  Administered 2021-08-02: 40 meq via ORAL
  Filled 2021-08-02: qty 2

## 2021-08-02 MED ORDER — METHOCARBAMOL 500 MG PO TABS: 500.0000 mg | ORAL_TABLET | Freq: Three times a day (TID) | ORAL | 0 refills | Status: DC | PRN

## 2021-08-02 MED ORDER — METOPROLOL TARTRATE 25 MG PO TABS: 25.0000 mg | ORAL_TABLET | Freq: Two times a day (BID) | ORAL | 0 refills | Status: DC

## 2021-08-02 MED ORDER — METOPROLOL TARTRATE 25 MG PO TABS
25.0000 mg | ORAL_TABLET | Freq: Two times a day (BID) | ORAL | 0 refills | Status: DC
Start: 2021-08-02 — End: 2021-08-02

## 2021-08-02 MED ORDER — POTASSIUM CHLORIDE CRYS ER 20 MEQ PO TBCR
40.0000 meq | EXTENDED_RELEASE_TABLET | Freq: Once | ORAL | Status: AC
  Administered 2021-08-02: 40 meq via ORAL
  Filled 2021-08-02: qty 2

## 2021-08-02 MED ORDER — NITROGLYCERIN 0.4 MG SL SUBL
0.8000 mg | SUBLINGUAL_TABLET | Freq: Once | SUBLINGUAL | Status: AC
  Administered 2021-08-02: 0.8 mg via SUBLINGUAL

## 2021-08-02 MED ORDER — IOHEXOL 350 MG/ML SOLN
100.0000 mL | Freq: Once | INTRAVENOUS | Status: AC | PRN
  Administered 2021-08-02: 100 mL via INTRAVENOUS

## 2021-08-02 MED ORDER — METHOCARBAMOL 500 MG PO TABS
500.0000 mg | ORAL_TABLET | Freq: Three times a day (TID) | ORAL | 0 refills | Status: DC | PRN
  Filled 2021-08-02: qty 15, 5d supply, fill #0

## 2021-08-02 MED ORDER — PNEUMOCOCCAL 20-VAL CONJ VACC 0.5 ML IM SUSY: 0.5000 mL | PREFILLED_SYRINGE | INTRAMUSCULAR | Status: DC

## 2021-08-02 MED ORDER — METOPROLOL TARTRATE 25 MG PO TABS
25.0000 mg | ORAL_TABLET | Freq: Two times a day (BID) | ORAL | 0 refills | Status: DC
  Filled 2021-08-02: qty 60, 30d supply, fill #0

## 2021-08-02 NOTE — Progress Notes (Addendum)
? ?Progress Note ? ?Patient Name: Allison Potts ?Date of Encounter: 08/02/2021 ? ?Primary Cardiologist: Little Ishikawa, MD  ? ?Subjective  ? ?Patient notes she is still having chest pressure. ?She notes that she has worries about it a cardiac CT is painful and would like to discuss risks and benefits. ?She is hungry and is excited to resume a diet. ? ?Inpatient Medications  ?  ?Scheduled Meds: ? aspirin EC  81 mg Oral Daily  ? buPROPion  300 mg Oral Daily  ? enoxaparin (LOVENOX) injection  40 mg Subcutaneous Q24H  ? gabapentin  300 mg Oral BID  ? losartan  100 mg Oral Daily  ? metoprolol tartrate  100 mg Oral Once  ? metoprolol tartrate  25 mg Oral BID  ? [START ON 08/03/2021] pneumococcal 20-valent conjugate vaccine  0.5 mL Intramuscular Tomorrow-1000  ? QUEtiapine  50 mg Oral QHS  ? rosuvastatin  20 mg Oral Daily  ? ?Continuous Infusions: ? ?PRN Meds: ?acetaminophen, fluticasone, HYDROcodone-acetaminophen, nitroGLYCERIN, ondansetron (ZOFRAN) IV  ? ?Vital Signs  ?  ?Vitals:  ? 08/02/21 0400 08/02/21 0500 08/02/21 0600 08/02/21 0812  ?BP: 127/79  (!) 150/69 (!) 145/62  ?Pulse: 61  63 63  ?Resp: 18  18 18   ?Temp: 97.7 ?F (36.5 ?C)   97.8 ?F (36.6 ?C)  ?TempSrc: Oral   Oral  ?SpO2: 98% 100% 99% 98%  ?Weight:      ?Height:      ? ?No intake or output data in the 24 hours ending 08/02/21 0835 ?Filed Weights  ? 08/01/21 1046 08/01/21 2130  ?Weight: 103.2 kg 98 kg  ? ? ?Telemetry  ?  ?SR - Personally Reviewed ? ? ?Physical Exam  ? ?Gen: no distress, Morbid Obesity   ?Neck: No JVD ?Cardiac: No Rubs or Gallops, no Murmur, RRR +2 radial pulses ?Respiratory: Clear to auscultation bilaterally, normal effort, normal  respiratory rate ?GI: Soft, nontender, non-distended  ?MS: No  edema;  moves all extremities ?Integument: Skin feels warm ?Neuro:  At time of evaluation, alert and oriented to person/place/time/situation  ?Psych: Anxious mood and affect ? ? ?Labs  ?  ?Chemistry ?Recent Labs  ?Lab 08/01/21 ?10/01/21 08/02/21 ?0224   ?NA 141 137  ?K 3.2* 3.3*  ?CL 106 108  ?CO2 28 24  ?GLUCOSE 99 116*  ?BUN 8 14  ?CREATININE 0.66 0.55  ?CALCIUM 8.7* 8.3*  ?GFRNONAA >60 >60  ?ANIONGAP 7 5  ?  ? ?Hematology ?Recent Labs  ?Lab 08/01/21 ?10/01/21 08/02/21 ?0224  ?WBC 7.4 7.2  ?RBC 4.65 4.32  ?HGB 14.1 12.9  ?HCT 41.3 38.4  ?MCV 88.8 88.9  ?MCH 30.3 29.9  ?MCHC 34.1 33.6  ?RDW 12.8 12.9  ?PLT 297 271  ? ? ?Cardiac EnzymesNo results for input(s): TROPONINI in the last 168 hours. No results for input(s): TROPIPOC in the last 168 hours.  ? ?BNP ?Recent Labs  ?Lab 08/01/21 ?1214  ?BNP 14.0  ?  ? ?DDimer  ?Recent Labs  ?Lab 08/01/21 ?1214  ?DDIMER 0.52*  ?  ? ?Radiology  ?  ?DG Chest 2 View ? ?Result Date: 08/01/2021 ?CLINICAL DATA:  Chest pain EXAM: CHEST - 2 VIEW COMPARISON:  None FINDINGS: Transverse diameter of heart is slightly increased. There are no signs of pulmonary edema or focal pulmonary consolidation. There is no pleural effusion or pneumothorax IMPRESSION: Cardiomegaly. There are no signs of pulmonary edema or focal pulmonary consolidation. Electronically Signed   By: 10/01/2021 M.D.   On: 08/01/2021 09:34   ? ? ?  Patient Profile  ?   ?61 y.o. female with hx of PAF, HTN, HLD (mixed) and Morbid Obesity OSA non on CPAP, Fibromyalgia/depression/anxiety with persistent CP ? ?Assessment & Plan  ?  ?Chest Pain Syndrome ?LVH on EKG ?HTN ?HLD ?Morbid Obesity ?OSA ?- D Dimer 0.52 Low Wells Score CTPE deferred by primary ?- cotninue losartan, metoprolol for CTPE; if BP is persistently elevated tartrate 25 can be switch to Coreg 6.25 mg PO BID ?- starting rosuvastatin 20 mg today ?- Pending echo and CCTA; if no obstructive disease discharge today would be reasonable from cardiac perspective (we are working to arrange outpatient follow up in the event this is the case) ?- will need CPAP start in outpatient follow up ? ?PAF  ?- very low burden in [place] only ?- despite CHADSVASC of 2; only on ASA; if return of PAF would transition to DOAC ?- Patient  is a TEFL teacher Witness and, after 4/4/ discussions, would not want blood products ? ? ? ?For questions or updates, please contact [facility] ?Please consult [email or web address] for contact info under Cardiology/STEMI. ?  ?   ?Signed, ?Christell Constant, MD  ?08/02/2021, 8:35 AM   ? ?

## 2021-08-02 NOTE — Plan of Care (Signed)

## 2021-08-03 LAB — HEMOGLOBIN A1C
Hgb A1c MFr Bld: 5.7 % — ABNORMAL HIGH (ref 4.8–5.6)
Mean Plasma Glucose: 117 mg/dL

## 2021-08-04 NOTE — Discharge Summary (Signed)
?Physician Discharge Summary ?  ?Patient: Allison Potts MRN: [id number] DOB: 28-Oct-1960  ?Admit date:     08/01/2021  ?Discharge date: 08/02/2021  ?Discharge Physician: Berle Mull  ?PCP: Lorene Dy, MD ? ?Recommendations at discharge: ? Follow up with PCP as recommended  ?Need a referral to pulmonary for lung nodule work up ?Can follow up with Cardiology as needed ? ?Discharge Diagnoses: ?Principal Problem: ?  Chest pain ?Active Problems: ?  Hypokalemia ?  Atrial fibrillation ([place]) ?  Primary hypertension ?  Anxiety and depression ?  Fibromyalgia ?  Prediabetes ?  Obstructive sleep apnea (adult) (pediatric) ?  Refusal of blood transfusions as patient is Jehovah's Witness ? ?Hospital Course: ?Allison Potts is a 61 y.o. female with medical history significant of atrial fibrillation on no anticoagulation, thyroid cancer s/p right thyroidectomy, OSA, depression and anxiety, fibromylagia who presents with chest pain ? ?Assessment and Plan: ?* Chest pain ?61 year old female with hx of HTN, HLD, obesity, isolated event of afib, prediabetes and strong family history of heart disease including MI in her sister at age 37 who presents with chest pain ?appears more atypical in nature  ?-troponin wnl x2, ekg nonspecific changes, continues to have chest pain ?cardiology consulted, CT CORONARY shows 0 coronary calcium score.  ?-d-dimer .42, age adjusted no elevated.  ?Lipid panel abnormal here but was normal with LDL in 60s at PCP in January. So will not start on statin for now.  ? ?Pulmonary nodule ?2.6 x 1.4 cm aggressive appearing nodule in the lateral segment of the right middle lobe, which appears similar to prior CT the abdomen and pelvis 10/25/2020. The persistence and appearance of this nodule is concerning for slow-growing neoplasm such as primary bronchogenic adenocarcinoma. Further evaluation with PET-CT is recommended in the near future to better evaluate this finding. ?Pt will contact PCP for further work  up. ? ?Hypokalemia ?Mag wnl ?repleted  ? ?Paroxysmal Atrial fibrillation ([place]) ?single episode of A. fib in June 2021, went to ED in [place] and converted to sinus rhythm spontaneously. CHA2DS2-VASc 2 (hypertension, female), she was not started on anticoagulation ?Last seen by cardiology 09/2020. No recurrence or complaints.  ? ?Primary hypertension ?Poorly controlled while in ED-anxious and in pain  ?Start back home medication of losartan 100mg  daily  ? ?Fibromyalgia ?Continue home medication of gabapentin ? ?Prediabetes ?A1c: 6.4 04/2021 ?Resume home regimen ? ?Obstructive sleep apnea (adult) (pediatric) ?Recent sleep study confirms OSA-AHI of 23.3 and severe oxygen desaturation  ?Waiting for cpap, but sleeps better with it on ?Ordered qhs while here  ? ?Refusal of blood transfusions as patient is Jehovah's Witness ?Noted  ? ?Obesity  ?Body mass index is 38.26 kg/m?Marland Kitchen  ?Placing the pt at higher risk of poor outcomes. ? ?Consultants: Cardiology  ?Procedures performed:  ?Echocardiogram  ?DISCHARGE MEDICATION: ?Allergies as of 08/02/2021   ? ?   Reactions  ? Penicillins Hives, Anaphylaxis, Rash  ? Passing out, lumps and bumps, was given epi ?Other reaction(s): Hives/Urticaria ?Trouble Breathing  ?Other reaction(s): Hives/Urticaria ?Trouble Breathing  ?Trouble Breathing   ? Antihistamines, Diphenhydramine-type   ? Pt does not want to take generic benedryl due to possible "side effects"  ? ?  ? ?  ?Medication List  ?  ? ?TAKE these medications   ? ?buPROPion 300 MG 24 hr tablet ?Commonly known as: WELLBUTRIN XL ?Take 300 mg by mouth daily. ?  ?fluticasone 50 MCG/ACT nasal spray ?Commonly known as: FLONASE ?Place 2 sprays into both nostrils daily as needed for  allergies. ?  ?gabapentin 300 MG capsule ?Commonly known as: NEURONTIN ?Take 300 mg by mouth 2 (two) times daily. ?  ?losartan 100 MG tablet ?Commonly known as: COZAAR ?Take 100 mg by mouth daily. ?  ?metFORMIN 750 MG 24 hr tablet ?Commonly known as:  GLUCOPHAGE-XR ?Take 750 mg by mouth every morning. ?  ?methocarbamol 500 MG tablet ?Commonly known as: ROBAXIN ?Take 1 tablet (500 mg total) by mouth every 8 (eight) hours as needed for muscle spasms. ?  ?metoprolol tartrate 25 MG tablet ?Commonly known as: LOPRESSOR ?Take 1 tablet (25 mg total) by mouth 2 (two) times daily. ?  ?Multivitamin Adult Chew ?Chew 1 each by mouth daily. ?  ?naproxen sodium 220 MG tablet ?Commonly known as: ALEVE ?Take 660 mg by mouth daily as needed (pain). ?  ?QUEtiapine 50 MG tablet ?Commonly known as: SEROQUEL ?Take 50 mg by mouth at bedtime. ?  ? ?  ? ? Follow-up Information   ? ? Burton Apley, MD Follow up.   ?Specialty: Internal Medicine ?Contact information: ?[address] [place] ?[place] [address] ?[phone] ? ? ?  ?  ? ? Little Ishikawa, MD .   ?Specialties: Cardiology, Radiology ?Contact information: ?[address] ?[address] ?[place] [address] ?[phone] ? ? ?  ?  ? ?  ?  ? ?  ? ?Disposition: Home ?Diet recommendation: Cardiac diet ? ?Discharge Exam: ?Filed Weights  ? 08/01/21 1046 08/01/21 2130  ?Weight: 103.2 kg 98 kg  ? ?General: Appear in no distress; no visible Abnormal Neck Mass Or lumps, Conjunctiva normal ?Cardiovascular: S1 and S2 Present, no Murmur, ?Respiratory: good respiratory effort, Bilateral Air entry present and CTA, no Crackles, no wheezes ?Abdomen: Bowel Sound present Non tender ?Extremities: no Pedal edema ?Neurology: alert and oriented to time, place, and person ?Gait not checked due to patient safety concerns  ? ?Condition at discharge: good ? ?The results of significant diagnostics from this hospitalization (including imaging, microbiology, ancillary and laboratory) are listed below for reference.  ? ?Imaging Studies: ?DG Chest 2 View ? ?Result Date: 08/01/2021 ?CLINICAL DATA:  Chest pain EXAM: CHEST - 2 VIEW COMPARISON:  None FINDINGS: Transverse diameter of heart is slightly increased. There are no signs of pulmonary edema or focal  pulmonary consolidation. There is no pleural effusion or pneumothorax IMPRESSION: Cardiomegaly. There are no signs of pulmonary edema or focal pulmonary consolidation. Electronically Signed   By: Ernie Avena M.D.   On: 08/01/2021 09:34  ? ?CT CORONARY MORPH W/CTA COR W/SCORE W/CA W/CM &/OR WO/CM ? ?Addendum Date: 08/02/2021   ?ADDENDUM REPORT: 08/02/2021 10:29 HISTORY: 61 yo female with chest pain/anginal equiv, ECGs and troponins normal EXAM: Cardiac/Coronary CTA TECHNIQUE: The patient was scanned on a [employer]. PROTOCOL: A 120 kV prospective scan was triggered in the descending thoracic aorta at 111 HU's. Axial non-contrast 3 mm slices were carried out through the heart. The data set was analyzed on a dedicated work station and scored using the Agatson method. Gantry rotation speed was 250 msecs and collimation was .6 mm. Beta blockade and 0.8 mg of sl NTG was given. The 3D data set was reconstructed in 5% intervals of the 35-75 % of the R-R cycle. Diastolic phases were analyzed on a dedicated work station using MPR, MIP and VRT modes. The patient received OMNIPAQUE IOHEXOL 350 MG/ML SOLN of contrast. FINDINGS: Quality: Fair, HR 65, mild misalignment artifact Coronary calcium score: The patient's coronary artery calcium score is 0, which places the patient  in the 0 percentile. Coronary arteries: Normal coronary origins.  Left dominance. Right Coronary Artery: Non-dominant.  No disease. Left Main Coronary Artery: Normal. Bifurcates into the LAD and LCx arteries. Left Anterior Descending Coronary Artery: Large anterior vessel that reaches the apex. No disease. 2 large diagonal branches without disease. Left Circumflex Artery: Dominant. Normal AV groove vessel with normal L-PDA and L-PLB branches. Large OM branch without disease. Aorta: Normal size, 28 mm at the mid ascending aorta (level of the PA bifurcation) measured double oblique. Aortic atherosclerosis. No dissection. Aortic Valve:  Trileaflet.  No calcifications. Other findings: Normal pulmonary vein drainage into the left atrium. Normal left atrial appendage without a thrombus. Normal size of the pulmonary artery. Mitral annular calcification. IMPRESSI

## 2022-05-28 ENCOUNTER — Other Ambulatory Visit: Payer: Self-pay | Admitting: Registered Nurse

## 2022-05-28 DIAGNOSIS — R748 Abnormal levels of other serum enzymes: Secondary | ICD-10-CM

## 2022-06-27 ENCOUNTER — Other Ambulatory Visit: Payer: Medicare (Managed Care)

## 2022-08-11 LAB — LAB REPORT - SCANNED: HM Hepatitis Screen: NEGATIVE

## 2022-08-23 ENCOUNTER — Ambulatory Visit: Payer: 59 | Admitting: Podiatry

## 2022-09-13 ENCOUNTER — Ambulatory Visit: Payer: 59 | Admitting: Podiatry

## 2022-09-28 ENCOUNTER — Ambulatory Visit (INDEPENDENT_AMBULATORY_CARE_PROVIDER_SITE_OTHER): Payer: 59 | Admitting: Podiatry

## 2022-09-28 DIAGNOSIS — M722 Plantar fascial fibromatosis: Secondary | ICD-10-CM | POA: Diagnosis not present

## 2022-09-28 NOTE — Progress Notes (Signed)
Subjective:  Patient ID: Allison Potts, female    DOB: Aug 02, 1960,  MRN: [id number]  Chief Complaint  Patient presents with   Foot Pain    62 y.o. female presents with the above complaint.  Patient presents with bilateral beginning signs of Planter fasciitis.  She wanted to get it evaluated she does not want injection.  She states that it hurts with she hurts with pressure she wanted to discuss some conservative treatment options are noninvasive.  Pain scale is 2 out of 10 manageable but beginning to start hurting in the   Review of Systems: Negative except as noted in the HPI. Denies N/V/F/Ch.  Past Medical History:  Diagnosis Date   Arthritis    Atrial fibrillation (HCC)    Fibromyalgia    Mental disorder    PID (acute pelvic inflammatory disease)    her 68s   Thyroid disease    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    Current Outpatient Medications:    buPROPion (WELLBUTRIN XL) 300 MG 24 hr tablet, Take 300 mg by mouth daily., Disp: , Rfl:    fluticasone (FLONASE) 50 MCG/ACT nasal spray, Place 2 sprays into both nostrils daily as needed for allergies., Disp: , Rfl:    gabapentin (NEURONTIN) 300 MG capsule, Take 300 mg by mouth 2 (two) times daily., Disp: , Rfl:    losartan (COZAAR) 100 MG tablet, Take 100 mg by mouth daily., Disp: , Rfl:    metFORMIN (GLUCOPHAGE-XR) 750 MG 24 hr tablet, Take 750 mg by mouth every morning., Disp: , Rfl:    methocarbamol (ROBAXIN) 500 MG tablet, Take 1 tablet (500 mg total) by mouth every 8 (eight) hours as needed for muscle spasms., Disp: 15 tablet, Rfl: 0   metoprolol tartrate (LOPRESSOR) 25 MG tablet, Take 1 tablet (25 mg total) by mouth 2 (two) times daily., Disp: 60 tablet, Rfl: 0   Multiple Vitamins-Minerals (MULTIVITAMIN ADULT) CHEW, Chew 1 each by mouth daily., Disp: , Rfl:    naproxen sodium (ALEVE) 220 MG tablet, Take 660 mg by mouth daily as needed (pain)., Disp: , Rfl:    QUEtiapine (SEROQUEL) 50 MG tablet, Take 50 mg by mouth at  bedtime., Disp: , Rfl:   Social History   Tobacco Use  Smoking Status Never  Smokeless Tobacco Never    Allergies  Allergen Reactions   Penicillins Hives, Anaphylaxis and Rash    Passing out, lumps and bumps, was given epi Other reaction(s): Hives/Urticaria Trouble Breathing  Other reaction(s): Hives/Urticaria Trouble Breathing  Trouble Breathing     Antihistamines, Diphenhydramine-Type     Pt does not want to take generic benedryl due to possible "side effects"   Objective:  There were no vitals filed for this visit. There is no height or weight on file to calculate BMI. Constitutional Well developed. Well nourished.  Vascular Dorsalis pedis pulses palpable bilaterally. Posterior tibial pulses palpable bilaterally. Capillary refill normal to all digits.  No cyanosis or clubbing noted. Pedal hair growth normal.  Neurologic Normal speech. Oriented to person, place, and time. Epicritic sensation to light touch grossly present bilaterally.  Dermatologic Nails well groomed and normal in appearance. No open wounds. No skin lesions.  Orthopedic: Normal joint ROM without pain or crepitus bilaterally. No visible deformities. Tender to palpation at the calcaneal tuber bilaterally. No pain with calcaneal squeeze bilaterally. Ankle ROM diminished range of motion bilaterally. Silfverskiold Test: positive bilaterally.   Radiographs: None  Assessment:   1. Plantar fasciitis of right foot   2. Plantar  fasciitis of left foot    Plan:  Patient was evaluated and treated and all questions answered.  Plantar Fasciitis, bilaterally - XR reviewed as above.  - Educated on icing and stretching. Instructions given.  -Clinically will hold off on any injection.  Will continue focusing on noninvasive techniques including shoe gear modification which was discussed discussed extensively as well as power steps insoles she states she will obtain them. -If there is no improvement we will  discuss injection and bracing  No follow-ups on file.  Bilateral beiningig PF. Shjoe changes and powersteps

## 2022-10-25 ENCOUNTER — Ambulatory Visit
Admission: EM | Admit: 2022-10-25 | Discharge: 2022-10-25 | Disposition: A | Discharge status: Home / Self Care | Payer: 59 | Attending: Family Medicine | Admitting: Family Medicine

## 2022-10-25 DIAGNOSIS — Z1152 Encounter for screening for COVID-19: Secondary | ICD-10-CM | POA: Insufficient documentation

## 2022-10-25 DIAGNOSIS — J069 Acute upper respiratory infection, unspecified: Secondary | ICD-10-CM | POA: Insufficient documentation

## 2022-10-25 DIAGNOSIS — R070 Pain in throat: Secondary | ICD-10-CM | POA: Diagnosis not present

## 2022-10-25 LAB — POCT RAPID STREP A (OFFICE): Rapid Strep A Screen: NEGATIVE

## 2022-10-25 MED ORDER — KETOROLAC TROMETHAMINE 30 MG/ML IJ SOLN
30.0000 mg | Freq: Once | INTRAMUSCULAR | Status: AC
  Administered 2022-10-25: 30 mg via INTRAMUSCULAR

## 2022-10-25 MED ORDER — NAPROXEN 500 MG PO TABS: 500.0000 mg | ORAL_TABLET | Freq: Two times a day (BID) | ORAL | 0 refills | Status: DC | PRN

## 2022-10-25 NOTE — Discharge Instructions (Addendum)
Your strep test is negative.  Culture of the throat will be sent, and staff will notify you if that is in turn positive.  You have been given a shot of Toradol 30 mg today.  Take naproxen 500 mg--1 tablet every 12 hours as needed for pain   You have been swabbed for COVID, and the test will result in the next 24 hours. Our staff will call you if positive. If the COVID test is positive, you should quarantine until you are fever free for 24 hours and you are starting to feel better, and then take added precautions for the next 5 days, such as physical distancing/wearing a mask and good hand hygiene/washing.    Marland Kitchen

## 2022-10-25 NOTE — ED Provider Notes (Addendum)
[facility]    CSN: [id number] Arrival date & time: 10/25/22  1242      History   Chief Complaint Chief Complaint  Patient presents with   Sore Throat    HPI Allison Potts is a 62 y.o. female.    Sore Throat   Here for sore throat, myalgia, and malaise. She has had chills, but no fever.  No n/v/d. No cough.  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    Patient Active Problem List   Diagnosis Date Noted   Chest pain 08/01/2021   Hypokalemia 08/01/2021   Refusal of blood transfusions as patient is Jehovah's Witness 05/31/2021   Atrial fibrillation (HCC) 05/29/2021   Prediabetes 05/29/2021   Primary hypertension 03/21/2020   Anxiety and depression 12/19/2015   Fibromyalgia 02/07/2012   Obstructive sleep apnea (adult) (pediatric) 07/31/2011    Past Surgical History:  Procedure Laterality Date   APPENDECTOMY     THYROID LOBECTOMY     TUBAL LIGATION      OB History     Gravida  6   Para  4   Term  4   Preterm      AB  2   Living  4      SAB  1   IAB  1   Ectopic      Multiple      Live Births  4        Obstetric Comments  Vaginal delivery x 4          Home Medications    Prior to Admission medications   Medication Sig Start Date End Date Taking? Authorizing Provider  naproxen (NAPROSYN) 500 MG tablet Take 1 tablet (500 mg total) by mouth 2 (two) times daily as needed (pain). 10/25/22  Yes Zenia Resides, MD  buPROPion (WELLBUTRIN XL) 300 MG 24 hr tablet Take 300 mg by mouth daily. 07/24/21   [provider]  fluticasone (FLONASE) 50 MCG/ACT nasal spray Place 2 sprays into both nostrils daily as needed for allergies.    [provider]  gabapentin (NEURONTIN) 300 MG capsule Take 300 mg by mouth 2  (two) times daily. 10/18/20   [provider]  levocetirizine (XYZAL) 5 MG tablet Take 5 mg by mouth at bedtime.    [provider]  losartan (COZAAR) 100 MG tablet Take 100 mg by mouth daily. 06/03/21   [provider]  metFORMIN (GLUCOPHAGE-XR) 750 MG 24 hr tablet Take 750 mg by mouth every morning. 05/30/21   [provider]  methocarbamol (ROBAXIN) 500 MG tablet Take 1 tablet (500 mg total) by mouth every 8 (eight) hours as needed for muscle spasms. 08/02/21   Rolly Salter, MD  metoprolol tartrate (LOPRESSOR) 25 MG tablet Take 1 tablet (25 mg total) by mouth 2 (two) times daily. 08/02/21   Rolly Salter, MD  Multiple Vitamins-Minerals (MULTIVITAMIN ADULT) CHEW Chew 1 each by mouth daily.    [provider]  QUEtiapine (SEROQUEL) 50 MG tablet Take 50 mg by mouth at bedtime. 07/10/21   [provider]  tamsulosin (FLOMAX) 0.4 MG CAPS capsule Take 0.4 mg by mouth daily.    [provider]    [facility]  History Family History  Problem Relation Age of Onset   Diabetes Mother    Heart disease Mother    Emphysema Mother    Heart disease Father    Diabetes Father     Social History Social History   Tobacco Use   Smoking status: Never   Smokeless tobacco: Never  Vaping Use   Vaping Use: Never used  Substance Use Topics   Alcohol use: Not Currently   Drug use: Not Currently     Allergies   Penicillins and Antihistamines, diphenhydramine-type   Review of Systems Review of Systems   Physical Exam Triage Vital Signs ED Triage Vitals  Enc Vitals Group     BP 10/25/22 1249 (!) 142/82     Pulse Rate 10/25/22 1249 74     Resp 10/25/22 1249 16     Temp 10/25/22 1249 98 F (36.7 C)     Temp Source 10/25/22 1249 Oral     SpO2 10/25/22 1249 96 %     Weight --      Height --      Head Circumference --      Peak Flow --      Pain Score 10/25/22 1250 6     Pain Loc --      Pain Edu? --      Excl. in GC? --    No data  found.  Updated Vital Signs BP (!) 142/82 (BP Location: Left Arm)   Pulse 74   Temp 98 F (36.7 C) (Oral)   Resp 16   SpO2 96%   Visual Acuity Right Eye Distance:   Left Eye Distance:   Bilateral Distance:    Right Eye Near:   Left Eye Near:    Bilateral Near:     Physical Exam Vitals reviewed.  Constitutional:      General: She is not in acute distress.    Appearance: She is not toxic-appearing.  HENT:     Right Ear: Tympanic membrane and ear canal normal.     Left Ear: Tympanic membrane and ear canal normal.     Nose: Nose normal.     Mouth/Throat:     Mouth: Mucous membranes are moist.     Comments: There is mild erythema of the OP with 1+ tonsils. No asymmetry Eyes:     Extraocular Movements: Extraocular movements intact.     Conjunctiva/sclera: Conjunctivae normal.     Pupils: Pupils are equal, round, and reactive to light.  Cardiovascular:     Rate and Rhythm: Normal rate and regular rhythm.     Heart sounds: No murmur heard. Pulmonary:     Effort: Pulmonary effort is normal. No respiratory distress.     Breath sounds: No stridor. No wheezing, rhonchi or rales.  Chest:     Chest wall: No tenderness.  Musculoskeletal:     Cervical back: Neck supple.  Lymphadenopathy:     Cervical: No cervical adenopathy.  Skin:    Capillary Refill: Capillary refill takes less than 2 seconds.     Coloration: Skin is not jaundiced or pale.  Neurological:     General: No focal deficit present.     Mental Status: She is alert and oriented to person, place, and time.  Psychiatric:        Behavior: Behavior normal.      UC Treatments / Results  Labs (all labs ordered are listed, but only abnormal results are displayed) Labs Reviewed  CULTURE, GROUP A STREP [facility])  SARS CORONAVIRUS 2 (TAT 6-24 HRS)  POCT RAPID STREP A (OFFICE)    EKG   Radiology No results found.  Procedures Procedures (including critical care time)  Medications Ordered in UC Medications   ketorolac (TORADOL) 30 MG/ML injection 30 mg (has no administration in time range)    Initial Impression / Assessment and Plan / UC Course  I have reviewed the triage vital signs and the nursing notes.  Pertinent labs & imaging results that were available during my care of the patient were reviewed by me and considered in my medical decision making (see chart for details).       Last eGFR was >60 in 2023 Rapid strep neg, so c/s done. We will notify and treat per protocol if positive.  COVID swab done. If positive, she is a candidate for Paxlovid.  Toradol given here for the pain, and naproxen sent to pharmacy Final Clinical Impressions(s) / UC Diagnoses   Final diagnoses:  Viral URI  Throat pain     Discharge Instructions      Your strep test is negative.  Culture of the throat will be sent, and staff will notify you if that is in turn positive.  You have been given a shot of Toradol 30 mg today.  Take naproxen 500 mg--1 tablet every 12 hours as needed for pain   You have been swabbed for COVID, and the test will result in the next 24 hours. Our staff will call you if positive. If the COVID test is positive, you should quarantine until you are fever free for 24 hours and you are starting to feel better, and then take added precautions for the next 5 days, such as physical distancing/wearing a mask and good hand hygiene/washing.    .     ED Prescriptions     Medication Sig Dispense Auth. Provider   naproxen (NAPROSYN) 500 MG tablet Take 1 tablet (500 mg total) by mouth 2 (two) times daily as needed (pain). 30 tablet Mammie Meras, Janace Aris, MD      PDMP not reviewed this encounter.   Zenia Resides, MD 10/25/22 1317    Zenia Resides, MD 10/25/22 1332

## 2022-10-25 NOTE — ED Triage Notes (Signed)
Pt states headache,body aches and sore throat for the 2 days. Has been taking Tylenol and motrin at home.

## 2022-10-26 LAB — SARS CORONAVIRUS 2 (TAT 6-24 HRS): SARS Coronavirus 2: NEGATIVE

## 2022-10-28 LAB — CULTURE, GROUP A STREP (THRC)

## 2022-11-22 ENCOUNTER — Other Ambulatory Visit: Payer: Self-pay

## 2022-11-22 ENCOUNTER — Encounter (HOSPITAL_COMMUNITY): Payer: Self-pay

## 2022-11-22 ENCOUNTER — Emergency Department (HOSPITAL_COMMUNITY)
Admission: EM | Admit: 2022-11-22 | Discharge: 2022-11-23 | Disposition: A | Discharge status: Home / Self Care | Payer: 59 | Source: Home / Self Care | Attending: Emergency Medicine | Admitting: Emergency Medicine

## 2022-11-22 DIAGNOSIS — M79605 Pain in left leg: Secondary | ICD-10-CM

## 2022-11-22 DIAGNOSIS — Z7984 Long term (current) use of oral hypoglycemic drugs: Secondary | ICD-10-CM | POA: Diagnosis not present

## 2022-11-22 DIAGNOSIS — M7989 Other specified soft tissue disorders: Secondary | ICD-10-CM | POA: Diagnosis not present

## 2022-11-22 DIAGNOSIS — Z79899 Other long term (current) drug therapy: Secondary | ICD-10-CM | POA: Diagnosis not present

## 2022-11-22 DIAGNOSIS — M79662 Pain in left lower leg: Secondary | ICD-10-CM | POA: Diagnosis not present

## 2022-11-22 DIAGNOSIS — M25572 Pain in left ankle and joints of left foot: Secondary | ICD-10-CM | POA: Diagnosis not present

## 2022-11-22 NOTE — ED Triage Notes (Signed)
Left lower leg pain beginning earlier today. Subjective swelling and rates pain 10/10.   Seen at [facility] and directed to ED for vascular ultrasound.

## 2022-11-23 ENCOUNTER — Ambulatory Visit (HOSPITAL_BASED_OUTPATIENT_CLINIC_OR_DEPARTMENT_OTHER)
Admission: RE | Admit: 2022-11-23 | Discharge: 2022-11-23 | Disposition: A | Discharge status: Home / Self Care | Payer: 59 | Source: Ambulatory Visit | Attending: Emergency Medicine | Admitting: Emergency Medicine

## 2022-11-23 ENCOUNTER — Ambulatory Visit (HOSPITAL_COMMUNITY): Payer: 59

## 2022-11-23 ENCOUNTER — Emergency Department (HOSPITAL_COMMUNITY): Payer: 59

## 2022-11-23 DIAGNOSIS — M7989 Other specified soft tissue disorders: Secondary | ICD-10-CM | POA: Insufficient documentation

## 2022-11-23 DIAGNOSIS — M25572 Pain in left ankle and joints of left foot: Secondary | ICD-10-CM | POA: Diagnosis not present

## 2022-11-23 DIAGNOSIS — M79605 Pain in left leg: Secondary | ICD-10-CM | POA: Insufficient documentation

## 2022-11-23 LAB — I-STAT CHEM 8, ED
BUN: 18 mg/dL (ref 8–23)
Calcium, Ion: 1.17 mmol/L (ref 1.15–1.40)
Chloride: 100 mmol/L (ref 98–111)
Creatinine, Ser: 0.9 mg/dL (ref 0.44–1.00)
Glucose, Bld: 157 mg/dL — ABNORMAL HIGH (ref 70–99)
HCT: 40 % (ref 36.0–46.0)
Hemoglobin: 13.6 g/dL (ref 12.0–15.0)
Potassium: 4 mmol/L (ref 3.5–5.1)
Sodium: 139 mmol/L (ref 135–145)
TCO2: 25 mmol/L (ref 22–32)

## 2022-11-23 MED ORDER — OXYCODONE-ACETAMINOPHEN 5-325 MG PO TABS
1.0000 | ORAL_TABLET | Freq: Once | ORAL | Status: AC
  Administered 2022-11-23: 1 via ORAL
  Filled 2022-11-23: qty 1

## 2022-11-23 MED ORDER — IBUPROFEN 800 MG PO TABS: 800.0000 mg | ORAL_TABLET | Freq: Once | ORAL | Status: DC

## 2022-11-23 NOTE — Discharge Instructions (Addendum)
X-ray today was normal.  Labs were also normal. Please return here in the morning to has ultrasound done. You do NOT need to check back into the ER, go to the vascular lab to have this done. If any abnormal results, you will be directed back to the ER.  Otherwise can follow-up with your doctor.

## 2022-11-23 NOTE — ED Provider Notes (Signed)
[place] EMERGENCY DEPARTMENT AT [facility] Provider Note   CSN: [id number] Arrival date & time: 11/22/22  2148     History  Chief Complaint  Patient presents with   Leg Pain    Allison Potts is a 62 y.o. female.  The history is provided by the patient and medical records.  Leg Pain  62 year old female with history of anxiety, depression, A-fib not on anticoagulation, fibromyalgia, presenting to the ED with left ankle/lower leg pain.  States today she went to go get up from a chair and felt a strong, pulling sensation in her left ankle.  She went to urgent care who felt like she may have a blood clot and sent her here for further evaluation.  She did not have any x-rays or any other test performed.  She denies any pain in the calf.  No chest pain or shortness of breath.  She has no history of DVT or PE previously.  No intervention prior to arrival.  Home Medications Prior to Admission medications   Medication Sig Start Date End Date Taking? Authorizing Provider  buPROPion (WELLBUTRIN XL) 300 MG 24 hr tablet Take 300 mg by mouth daily. 07/24/21   [provider]  fluticasone (FLONASE) 50 MCG/ACT nasal spray Place 2 sprays into both nostrils daily as needed for allergies.    [provider]  gabapentin (NEURONTIN) 300 MG capsule Take 300 mg by mouth 2 (two) times daily. 10/18/20   [provider]  levocetirizine (XYZAL) 5 MG tablet Take 5 mg by mouth at bedtime.    [provider]  losartan (COZAAR) 100 MG tablet Take 100 mg by mouth daily. 06/03/21   [provider]  metFORMIN (GLUCOPHAGE-XR) 750 MG 24 hr tablet Take 750 mg by mouth every morning. 05/30/21   [provider]  methocarbamol (ROBAXIN) 500 MG tablet Take 1 tablet (500 mg total) by mouth every 8 (eight) hours as needed for muscle spasms. 08/02/21   Rolly Salter, MD  metoprolol tartrate (LOPRESSOR) 25 MG tablet Take 1 tablet (25 mg total) by mouth 2 (two) times  daily. 08/02/21   Rolly Salter, MD  Multiple Vitamins-Minerals (MULTIVITAMIN ADULT) CHEW Chew 1 each by mouth daily.    [provider]  naproxen (NAPROSYN) 500 MG tablet Take 1 tablet (500 mg total) by mouth 2 (two) times daily as needed (pain). 10/25/22   Zenia Resides, MD  QUEtiapine (SEROQUEL) 50 MG tablet Take 50 mg by mouth at bedtime. 07/10/21   [provider]  tamsulosin (FLOMAX) 0.4 MG CAPS capsule Take 0.4 mg by mouth daily.    [provider]      Allergies    Penicillins and Antihistamines, diphenhydramine-type    Review of Systems   Review of Systems  Cardiovascular:  Positive for leg swelling.  All other systems reviewed and are negative.   Physical Exam Updated Vital Signs BP (!) 152/72 (BP Location: Right Arm)   Pulse 75   Temp 97.6 F (36.4 C) (Oral)   Resp 18   Ht 5\' 3"  (1.6 m)   Wt 95.3 kg   SpO2 100%   BMI 37.20 kg/m   Physical Exam Vitals and nursing note reviewed.  Constitutional:      Appearance: She is well-developed.  HENT:     Head: Normocephalic and atraumatic.  Eyes:     Conjunctiva/sclera: Conjunctivae normal.     Pupils: Pupils are equal, round, and reactive to light.  Cardiovascular:  Rate and Rhythm: Normal rate and regular rhythm.     Heart sounds: Normal heart sounds.  Pulmonary:     Effort: Pulmonary effort is normal.     Breath sounds: Normal breath sounds.  Abdominal:     General: Bowel sounds are normal.     Palpations: Abdomen is soft.  Musculoskeletal:        General: Normal range of motion.     Cervical back: Normal range of motion.     Comments: Left ankle with tenderness along medial aspect; there is no appreciable swelling, no calf asymmetry, no calf tenderness, no palpable cords; compartments soft and compressible, DP pulse intact, normal distal sensation; no open wounds/sores noted to the feet  Skin:    General: Skin is warm and dry.  Neurological:     Mental Status: She is alert and  oriented to person, place, and time.     ED Results / Procedures / Treatments   Labs (all labs ordered are listed, but only abnormal results are displayed) Labs Reviewed  I-STAT CHEM 8, ED - Abnormal; Notable for the following components:      Result Value   Glucose, Bld 157 (*)    All other components within normal limits    EKG None  Radiology DG Ankle Complete Left  Result Date: 11/23/2022 CLINICAL DATA:  Left ankle pain and swelling. EXAM: LEFT ANKLE COMPLETE - 3+ VIEW COMPARISON:  None Available. FINDINGS: There is no acute fracture or dislocation. The bones are well mineralized. No significant arthritic changes. An os trigonum noted. The ankle mortise is intact. Mild soft tissue swelling of the ankle. No radiopaque foreign object or soft tissue gas. IMPRESSION: 1. No acute fracture or dislocation. 2. Mild soft tissue swelling. Electronically Signed   By: Elgie Collard M.D.   On: 11/23/2022 01:34    Procedures Procedures    Medications Ordered in ED Medications  ibuprofen (ADVIL) tablet 800 mg (has no administration in time range)  oxyCODONE-acetaminophen (PERCOCET/ROXICET) 5-325 MG per tablet 1 tablet (1 tablet Oral Given 11/23/22 0129)    ED Course/ Medical Decision Making/ A&P                             Medical Decision Making Amount and/or Complexity of Data Reviewed Radiology: ordered and independent interpretation performed. ECG/medicine tests: ordered and independent interpretation performed.  Risk Prescription drug management.   62 year old female with left ankle pain.  She was seen in urgent care and sent here with concern of DVT.  She denies any fall or trauma.  Her pain is actually localized to the medial left ankle.  This is reproducibly tender.  She does not have any significant swelling on exam.  She does not have any calf asymmetry, calf tenderness, or palpable cords.  Both feet are neurovascularly intact, compartments are soft.  No open wounds or  sores.  Ankle films negative for any acute findings.  Chemistry is reassuring.  Unfortunately, vascular study not available at this hour.  Will have patient return to vascular lab in the morning to have this completed.  If any abnormal findings she will be directed back to the ER, otherwise can follow-up with PCP.  She can return here for any new or acute changes.  Final Clinical Impression(s) / ED Diagnoses Final diagnoses:  Left leg pain    Rx / DC Orders ED Discharge Orders          Ordered  11/23/22 0316    LE VENOUS        11/23/22 0319              Garlon Hatchet, PA-C 11/23/22 0351    Tilden Fossa, MD 11/23/22 [phone]

## 2023-02-06 LAB — LAB REPORT - SCANNED
A1c: 6.6
EGFR: 86
HM HIV Screening: NEGATIVE

## 2023-02-26 ENCOUNTER — Encounter: Payer: 59 | Attending: Registered Nurse | Admitting: Dietician

## 2023-02-26 ENCOUNTER — Encounter: Payer: Self-pay | Admitting: Dietician

## 2023-02-26 DIAGNOSIS — E119 Type 2 diabetes mellitus without complications: Secondary | ICD-10-CM | POA: Diagnosis present

## 2023-02-26 NOTE — Progress Notes (Signed)
Patient was seen on 02/26/23 for the first of a series of three diabetes self-management courses at the [facility].  Patient Education Plan per assessed needs and concerns is to attend three course education program for Diabetes Self Management Education.  A1C was 6.6% in 07/2022.  The following learning objectives were met by the patient during this class: Describe diabetes, types of diabetes and pathophysiology State some common risk factors for diabetes Defines the role of glucose and insulin Describe the relationship between diabetes and cardiovascular and other risks State the members of the Healthcare Team States the rationale for glucose monitoring and when to test State their individual Target Range State the importance of logging glucose readings and how to interpret the readings Identifies A1C target Explain the correlation between A1c and eAG values State symptoms and treatment of high blood glucose and low blood glucose Explain proper technique for glucose testing and identify proper sharps disposal  Handouts given during class include: How to Thrive:  A Guide for Your Journey with Diabetes by the ADA Meal Plan Card and carbohydrate content list Dietary intake form Low Sodium Flavoring Tips Types of Fats Dining Out Label reading Snack list The diabetes portion plate Diabetes Resources A1c to eAG Conversion Chart Blood Glucose Log Diabetes Recommended Care Schedule Support Group Diabetes Success Plan Core Class Satisfaction Survey   Follow-Up Plan: Attend core 2

## 2023-03-05 ENCOUNTER — Encounter: Payer: 59 | Attending: Registered Nurse | Admitting: Dietician

## 2023-03-05 ENCOUNTER — Encounter: Payer: Self-pay | Admitting: Dietician

## 2023-03-05 DIAGNOSIS — Z6841 Body Mass Index (BMI) 40.0 and over, adult: Secondary | ICD-10-CM | POA: Diagnosis not present

## 2023-03-05 DIAGNOSIS — Z7984 Long term (current) use of oral hypoglycemic drugs: Secondary | ICD-10-CM | POA: Diagnosis not present

## 2023-03-05 DIAGNOSIS — E119 Type 2 diabetes mellitus without complications: Secondary | ICD-10-CM

## 2023-03-05 DIAGNOSIS — Z7985 Long-term (current) use of injectable non-insulin antidiabetic drugs: Secondary | ICD-10-CM | POA: Diagnosis not present

## 2023-03-05 DIAGNOSIS — E1169 Type 2 diabetes mellitus with other specified complication: Secondary | ICD-10-CM | POA: Insufficient documentation

## 2023-03-05 DIAGNOSIS — Z713 Dietary counseling and surveillance: Secondary | ICD-10-CM | POA: Insufficient documentation

## 2023-03-05 NOTE — Progress Notes (Signed)
Patient was seen on 03/05/23 for the second of a series of three diabetes self-management courses at the [facility]. The following learning objectives were met by the patient during this class:  Describe the role of different macronutrients on glucose Explain how carbohydrates affect blood glucose State what foods contain the most carbohydrates Demonstrate carbohydrate counting Demonstrate how to read Nutrition Facts food label Describe effects of various fats on heart health Describe the importance of good nutrition for health and healthy eating strategies Describe techniques for managing your shopping, cooking and meal planning List strategies to follow meal plan when dining out Describe the effects of alcohol on glucose and how to use it safely  Goals:  Follow Diabetes Meal Plan as instructed  Aim to spread carbs evenly throughout the day  Aim for 3 meals per day and snacks as needed Include lean protein foods to meals/snacks  Monitor glucose levels as instructed by your doctor   Follow-Up Plan: Attend Core 3 Work towards following your personal food plan.

## 2023-03-12 ENCOUNTER — Encounter: Payer: Self-pay | Admitting: Dietician

## 2023-03-12 ENCOUNTER — Encounter: Payer: 59 | Attending: Registered Nurse | Admitting: Dietician

## 2023-03-12 DIAGNOSIS — Z713 Dietary counseling and surveillance: Secondary | ICD-10-CM | POA: Insufficient documentation

## 2023-03-12 DIAGNOSIS — E119 Type 2 diabetes mellitus without complications: Secondary | ICD-10-CM | POA: Insufficient documentation

## 2023-03-12 NOTE — Progress Notes (Signed)
Patient was seen on 03/12/2023 for the third of a series of three diabetes self-management courses at the [facility].   State the amount of activity recommended for healthy living Describe activities suitable for individual needs Identify ways to regularly incorporate activity into daily life Identify barriers to activity and ways to over come these barriers Identify diabetes medications being personally used and their primary action for lowering glucose and possible side effects Describe role of stress on blood glucose and develop strategies to address psychosocial issues Identify diabetes complications and ways to prevent them Explain how to manage diabetes during illness Evaluate success in meeting personal goal Establish 2-3 goals that they will plan to diligently work on  Goals:  I will be active 30 minutes or more 3 times a week I will eat less unhealthy fats by eating less saturated fat. I will test my glucose at least 1 times a day, 5 days a week  Your patient has identified these potential barriers to change: (did not report) Motivation Finances Stress Lack of Family Support  Your patient has identified their diabetes self-care support plan as  Family Education officer, environmental Resources [facility] Support Group  American Diabetes Association Website    Plan:  Attend Support Group as desired

## 2023-05-23 ENCOUNTER — Other Ambulatory Visit: Payer: Self-pay | Admitting: Registered Nurse

## 2023-05-23 DIAGNOSIS — Z85850 Personal history of malignant neoplasm of thyroid: Secondary | ICD-10-CM

## 2023-05-27 ENCOUNTER — Other Ambulatory Visit: Payer: 59

## 2023-06-03 ENCOUNTER — Ambulatory Visit
Admission: RE | Admit: 2023-06-03 | Discharge: 2023-06-03 | Disposition: A | Discharge status: Home / Self Care | Payer: 59 | Source: Ambulatory Visit | Attending: Registered Nurse | Admitting: Registered Nurse

## 2023-06-03 DIAGNOSIS — Z85850 Personal history of malignant neoplasm of thyroid: Secondary | ICD-10-CM

## 2023-06-04 NOTE — Progress Notes (Signed)
 [address] - AMBULATORY [facility]  - [facility] [place] [address] [place] [id number]  History of Present Illness   History given by patient  Subjective Allison Potts is a 63 y.o. female who presents for Sore Throat (Three days ago sore throat started, chest hurts from coughing. Congestion as well. Pt states no fever ) History of Present Illness The patient is a 63 year old female who presents for evaluation of cough, sore throat, and nasal congestion.  She reports the onset of her symptoms approximately 7 to 10 days ago, coinciding with a trip to [place]  following the death of her mother. During this period, she contracted COVID-19, confirmed by a test at an urgent care facility 10 days ago. She was advised to self-isolate for 5 days, which she complied with. She did not experience fever, chills, night sweats, cough, or sputum production during her COVID-19 infection. Upon returning home, she developed a severe cough over the past 3 days, reminiscent of a previous episode of walking pneumonia due to the associated chest pain. She also reports back pain and the recent onset of throat pain. She does not report any body aches, nausea, vomiting, diarrhea, or abdominal pain. Her cough is predominantly present during her waking hours. She experiences nasal congestion and a sensation of pressure in her chest. She has not taken ibuprofen  for her symptoms. She has no history of rib fractures or falls. She has a history of left-sided pneumonia.  She has a history of controlled blood pressure and atrial fibrillation, which was monitored by a cardiologist for a week and deemed non-concerning. She has been using Flonase  nasal spray and nasal saline spray to manage her symptoms. She has an albuterol inhaler but has not used it recently due to a lack of asthma symptoms. She does not have a nebulizer solution or machine and does not require another  inhaler.  Supplemental Information She has a history of severe acid reflux, for which she is currently on medication.  MEDICATIONS Current: albuterol, fluticasone  nasal spray, nasal saline spray  Patient has no co-morbidities  or medications that might increase the risk for developing a severe infection   Past Medical History:  Diagnosis Date  . ADD (attention deficit disorder)   . Anxiety   . Arthritis   . Asthma   . Atrial fibrillation (CMD)   . Depression   . Diabetes mellitus (CMD)   . Fibromyalgia   . Sleep apnea     Review of Systems   Review of Systems   Negative except per HPI noted above.   Physicial Exam   Physical Exam Vitals and nursing note reviewed.  Constitutional:      General: She is not in acute distress.    Appearance: Normal appearance. She is not ill-appearing, toxic-appearing or diaphoretic.  HENT:     Head: Normocephalic and atraumatic.  Eyes:     Extraocular Movements: Extraocular movements intact.  Cardiovascular:     Rate and Rhythm: Normal rate and regular rhythm.     Pulses: Normal pulses.     Heart sounds: Normal heart sounds.  Pulmonary:     Effort: Pulmonary effort is normal.     Breath sounds: Normal breath sounds.  Musculoskeletal:        General: Normal range of motion.     Cervical back: Normal range of motion and neck supple.  Skin:    General: Skin is warm and dry.  Capillary Refill: Capillary refill takes less than 2 seconds.  Neurological:     General: No focal deficit present.     Mental Status: She is alert and oriented to person, place, and time.     Gait: Gait is intact.  Psychiatric:        Mood and Affect: Mood normal.        Behavior: Behavior is cooperative.     Objective Blood pressure 133/72, pulse 75, temperature 98.4 F (36.9 C), temperature source Tympanic, resp. rate 19, weight 99.8 kg (220 lb), SpO2 97%. Physical Exam Throat appears normal with no redness or swelling. Ears appear normal  bilaterally. Nose does not show much congestion. No facial sinus tenderness. Lymph nodes are tender. Wheezing noted in the lower lung fields. Heart exhibits a normal rate and rhythm.  Vital Signs Oxygen saturation is 97 percent. Pulse is 77.  Labs   Recent Results (from the past 48 hours)  POC Rapid Strep A   Collection Time: 06/04/23  2:05 PM  Result Value Ref Range   Strep A Antigen Negative Negative   Internal Control Acceptable    Kit/Device Lot # [id number]    Kit/Device Expiration Date 10/27/2024   POC Influenza A&B NAT (IDNOW)   Collection Time: 06/04/23  2:05 PM  Result Value Ref Range   Influenza A RNA Negative Negative   Influenza B RNA Negative Negative     Results Laboratory Studies Strep test is negative. Flu test is negative.  Imaging No evidence of acute cardiopulmonary abnormality.   Imaging   XR Chest 2 Views  Final Result by Debby Catarina Dibble, MD (02/04 1417)  XR CHEST 2 VIEWS, 06/04/2023 1:54 PM    INDICATION: Acute cough \ R05.1 Acute cough   COMPARISON: March 05, 2023    FINDINGS:     Cardiovascular: Cardiac silhouette and pulmonary vasculature are within   normal limits.  Mediastinum: Within normal limits.  Lungs/pleura: Mild atelectasis in the lung bases. No consolidation or   pulmonary edema. No pneumothorax or pleural effusion.  Upper abdomen: Visualized portions are unremarkable.   Chest wall/osseous structures: Stable polyarticular degenerative changes.      IMPRESSION:  There is no evidence of acute cardiac or pulmonary abnormality.       I have personally reviewed imaging.  Diagnosis  Medical Decision Making Allison Potts is a 63 y.o. female who presents to urgent care today with complaints of  Chief Complaint  Patient presents with  . Sore Throat    Three days ago sore throat started, chest hurts from coughing. Congestion as well. Pt states no fever     1. COVID-19 (Primary)  2. Acute cough - XR Chest 2 Views -  ipratropium-albuteroL (DUO-NEB) 0.5-2.5 mg/3 mL nebulizer solution 3 mL  3. Sore throat - POC Rapid Strep A  4. Body aches - POC Influenza A&B NAT (IDNOW)  5. Wheezing - ipratropium-albuteroL (DUO-NEB) 0.5-2.5 mg/3 mL nebulizer solution 3 mL   On exam, VS stable and patient is afebrile and in no acute distress. Appears well-hydrated and capillary refill <2 seconds.  Assessment & Plan 1. Cough. The patient reports a persistent cough for the past 3 days, accompanied by chest pain and nasal congestion. She has a history of walking pneumonia and recently tested positive for COVID-19. Examination revealed wheezing in the lower lung fields. The patient received a DuoNeb treatment during the visit, which improved her symptoms. A chest x-ray was was performed, which ruled out pneumonia. A prescription for  Brofed was provided. She was advised to use nasal saline, Flonase , Zyrtec, and Mucinex D for nasal congestion. She was instructed to avoid hydroxyzine or any other antihistamines while taking the prescribed cough syrup. She was also advised to maintain social distancing, wear a mask, practice good hand hygiene, and avoid crowded places. If her symptoms worsen, she should seek immediate medical attention.  2. Sore throat. The patient reports a sore throat that started recently. Examination of the throat showed no redness or swelling. A strep test and flu test were conducted and returned negative. She was advised to monitor her symptoms and follow up if they worsen.  3. Nasal congestion. The patient reports nasal congestion. She was advised to use nasal saline, Flonase , Zyrtec, and Mucinex D for relief.  4. History of COVID-19. The patient tested positive for COVID-19 approximately 10 days ago and completed a 5-day isolation period. She did not experience fever, chills, or night sweats during her COVID-19 infection. She was advised to continue monitoring her symptoms and seek medical attention if they  worsen.  PROCEDURE The patient received a DuoNeb treatment during the visit, which improved her symptoms.   Discharge Information  Symptomatic management discussed.  Patient was given verbal and written instructions on symptoms that necessitate return to the UC/ED, and instructed to f/u w/ UC or PCP if not improving in expected timeframe.   2. Patient/parent has been instructed on RX/OTC medications, dosages, side effects, and possible interactions as associated with each diagnosis in my impression and plan  above.   3. Patient education (verbal/handout) given on diagnosis, pathophysiology, treatment of diagnosis, side effects of medication use for treatment, restrictions while taking medication, supportives measures such as staying hydrated.   4. Red Flags associated with diagnosis/es were reviewed and patient instructed on action plan if red flags develop. They have been instructed that if symptoms worsen or red flags develop they should return to Urgent Care, go to the nearest ED, or activate EMS/911.     5. Patient and/or parent/guardian (if applicable) agreed with plan and voiced understanding.  No barriers to adherence perceived by myself.  Portions of this note may have been dictated using Dragon dictation software/hardware and may contain grammatical or spelling errors.    If a new prescription was given today, then I discussed potential side effects, drug interactions, instructions for taking the medication, and the consequences of not taking it.    F/u: Follow up closely with primary care provider (PCP) and other specialists for further care and routine care, but seek medical attention sooner if worsening/concerning signs or symptoms.  Follow up with PCP A portion of this chart was generated utilizing [employer]  Electronically signed by: Elvira Dublin, PA-C 06/04/2023 2:25 PM

## 2023-06-11 ENCOUNTER — Encounter: Payer: Self-pay | Admitting: Dietician

## 2023-06-11 ENCOUNTER — Encounter: Payer: 59 | Attending: Registered Nurse | Admitting: Dietician

## 2023-06-11 VITALS — Wt 224.0 lb

## 2023-06-11 DIAGNOSIS — E119 Type 2 diabetes mellitus without complications: Secondary | ICD-10-CM | POA: Diagnosis present

## 2023-06-11 NOTE — Progress Notes (Signed)
Appointment start:  0915 Appointment End:  0940  Patient attended Diabetes Core Classes 1-3 between 02/26/23 and 03/12/23 at [facility]. The purpose of the meeting today is to review information learned during those classes as well as review patient application and goals.   What are one or two positive things that you are doing right now to manage your diabetes?  Went to cooking classes and incorporated exercise  What is the hardest part about your diabetes right now, causing you the most concern, or is the most worrisome to you about your diabetes?  N/A  What questions do you have today?  Easy meal ideas.   Have you participated in any diabetes support group?  No, not interested at this time.   History:  arthritis, thyroid disease, type 2 diabetes A1C:  5.7% (pt report) Medications include:  farxiga Sleep:  not assessed Weight:  224 lb Blood Glucose: pt not checking Social History:  has been going to cooking classes, dancing at home Exercise:  dancing at home 5-7 days per week for 20 minutes.   24 hour diet recall: Breakfast:  egg and toast Snack:  none OR grapes Lunch:  protein shake Snack:  apple and peanut butter Dinner:  baked chicken and vegetables Snack:  none Beverages:  32-64 oz water,    Specific focus but not limited to the following: Review of blood glucose monitoring and interpretation including the recommended target ranges and Hgb A1c.  Review of carbohydrate counting, importance of regularly scheduled meals/snacks, and meal planning to improve quality of diet. Review of the effects of physical activity on glucose levels and long-term glucose control.  Recommended goal of 150 minutes of physical activity/week. Review of patient medications and discussed role of medication on blood glucose and possible side effects. Discussion of strategies to manage stress, psychosocial issues, and other obstacles to diabetes management. Review of  short-term complications: hyper- and hypo-glycemia (causes, symptoms, and treatment options) Review of prevention, detection, and treatment of long-term complications.  Discussion of the role of prolonged elevated glucose levels on body systems.  Continuing Goals:  Aim for 3-4 water bottles daily.   Continue dancing 5 days per week.  Pair fruit with a protein source.   Future Follow up:  as needed

## 2023-07-04 ENCOUNTER — Other Ambulatory Visit: Payer: Self-pay

## 2023-07-04 DIAGNOSIS — E119 Type 2 diabetes mellitus without complications: Secondary | ICD-10-CM

## 2023-07-16 ENCOUNTER — Other Ambulatory Visit: Payer: 59

## 2023-07-17 LAB — COMPLETE METABOLIC PANEL WITH GFR
AG Ratio: 1.9 (calc) (ref 1.0–2.5)
ALT: 42 U/L — ABNORMAL HIGH (ref 6–29)
AST: 52 U/L — ABNORMAL HIGH (ref 10–35)
Albumin: 4.3 g/dL (ref 3.6–5.1)
Alkaline phosphatase (APISO): 85 U/L (ref 37–153)
BUN: 17 mg/dL (ref 7–25)
CO2: 28 mmol/L (ref 20–32)
Calcium: 9.2 mg/dL (ref 8.6–10.4)
Chloride: 103 mmol/L (ref 98–110)
Creat: 0.68 mg/dL (ref 0.50–1.05)
Globulin: 2.3 g/dL (ref 1.9–3.7)
Glucose, Bld: 87 mg/dL (ref 65–99)
Potassium: 4.3 mmol/L (ref 3.5–5.3)
Sodium: 140 mmol/L (ref 135–146)
Total Bilirubin: 0.6 mg/dL (ref 0.2–1.2)
Total Protein: 6.6 g/dL (ref 6.1–8.1)

## 2023-07-17 LAB — HEMOGLOBIN A1C
Hgb A1c MFr Bld: 6.1 %{Hb} — ABNORMAL HIGH (ref <5.7)
Mean Plasma Glucose: 128 mg/dL
eAG (mmol/L): 7.1 mmol/L

## 2023-07-17 LAB — MICROALBUMIN / CREATININE URINE RATIO
Creatinine, Urine: 109 mg/dL (ref 20–275)
Microalb Creat Ratio: 11 mg/g{creat} (ref <30)
Microalb, Ur: 1.2 mg/dL

## 2023-07-17 LAB — LIPID PANEL
Cholesterol: 221 mg/dL — ABNORMAL HIGH (ref <200)
HDL: 49 mg/dL — ABNORMAL LOW (ref >=50)
LDL Cholesterol (Calc): 118 mg/dL — ABNORMAL HIGH
Non-HDL Cholesterol (Calc): 172 mg/dL — ABNORMAL HIGH (ref <130)
Total CHOL/HDL Ratio: 4.5 (calc) (ref <5.0)
Triglycerides: 380 mg/dL — ABNORMAL HIGH (ref <150)

## 2023-07-17 LAB — C-PEPTIDE: C-Peptide: 6.21 ng/mL — ABNORMAL HIGH (ref 0.80–3.85)

## 2023-07-19 ENCOUNTER — Encounter: Payer: Self-pay | Admitting: "Endocrinology

## 2023-07-19 ENCOUNTER — Ambulatory Visit (INDEPENDENT_AMBULATORY_CARE_PROVIDER_SITE_OTHER): Payer: 59 | Admitting: "Endocrinology

## 2023-07-19 VITALS — BP 140/80 | HR 75 | Ht 63.0 in | Wt 225.0 lb

## 2023-07-19 DIAGNOSIS — R7303 Prediabetes: Secondary | ICD-10-CM | POA: Diagnosis not present

## 2023-07-19 DIAGNOSIS — E782 Mixed hyperlipidemia: Secondary | ICD-10-CM | POA: Diagnosis not present

## 2023-07-19 MED ORDER — SEMAGLUTIDE(0.25 OR 0.5MG/DOS) 2 MG/3ML ~~LOC~~ SOPN: 0.2500 mg | PEN_INJECTOR | SUBCUTANEOUS | 1 refills | Status: DC

## 2023-07-19 MED ORDER — ROSUVASTATIN CALCIUM 10 MG PO TABS: 20.0000 mg | ORAL_TABLET | Freq: Every day | ORAL | 1 refills | Status: AC

## 2023-07-19 NOTE — Patient Instructions (Addendum)
 Start ozempic 0.25 mg/week for 4 weeks->0.5 mg/week for 4 week-> 1 mg/week for 4 weeks  ___________________  Goals of DM therapy:  Morning Fasting blood sugar: 80-140  Blood sugar before meals: 80-140 Bed time blood sugar: 100-150  A1C <7%, limited only by hypoglycemia  1.Diabetes medications and their side effects discussed, including hypoglycemia    2. Check blood glucose:  a) Always check blood sugars before driving. Please see below (under hypoglycemia) on how to manage b) Check a minimum of 3 times/day or more as needed when having symptoms of hypoglycemia.   c) Try to check blood glucose before sleeping/in the middle of the night to ensure that it is remaining stable and not dropping less than 100 d) Check blood glucose more often if sick  3. Diet: a) 3 meals per day schedule b: Restrict carbs to 60-70 grams (4 servings) per meal c) Colorful vegetables - 3 servings a day, and low sugar fruit 2 servings/day Plate control method: 1/4 plate protein, 1/4 starch, 1/2 green, yellow, or red vegetables d) Avoid carbohydrate snacks unless hypoglycemic episode, or increased physical activity  4. Regular exercise as tolerated, preferably 3 or more hours a week  5. Hypoglycemia: a)  Do not drive or operate machinery without first testing blood glucose to assure it is over 90 mg%, or if dizzy, lightheaded, not feeling normal, etc, or  if foot or leg is numb or weak. b)  If blood glucose less than 70, take four 5gm Glucose tabs or 15-30 gm Glucose gel.  Repeat every 15 min as needed until blood sugar is >100 mg/dl. If hypoglycemia persists then call 911.   6. Sick day management: a) Check blood glucose more often b) Continue usual therapy if blood sugars are elevated.   7. Contact the doctor immediately if blood glucose is frequently <60 mg/dl, or an episode of severe hypoglycemia occurs (where someone had to give you glucose/  glucagon or if you passed out from a low blood glucose),  or if blood glucose is persistently >350 mg/dl, for further management  8. A change in level of physical activity or exercise and a change in diet may also affect your blood sugar. Check blood sugars more often and call if needed.  Instructions: 1. Bring glucose meter, blood glucose records on every visit for review 2. Continue to follow up with primary care physician and other providers for medical care 3. Yearly eye  and foot exam 4. Please get blood work done prior to the next appointment

## 2023-07-19 NOTE — Progress Notes (Signed)
 Outpatient Endocrinology Note Allison Oval, MD  07/19/23   Keniesha Adderly 04-13-1961 [id number]  Referring Provider: Loura Back, NP Primary Care Provider: Loura Back, NP Reason for consultation: Subjective   Assessment & Plan  Diagnoses and all orders for this visit:  Pre-diabetes  Mixed hypercholesterolemia and hypertriglyceridemia  Other orders -     rosuvastatin (CRESTOR) 10 MG tablet; Take 2 tablets (20 mg total) by mouth daily. -     Semaglutide,0.25 or 0.5MG /DOS, 2 MG/3ML SOPN; Inject 0.25 mg into the skin once a week.   Pre-diabetes, No results found for: "GFR" Hba1c goal less than 7, current Hba1c is  Lab Results  Component Value Date   HGBA1C 6.1 (H) 07/16/2023   Will recommend the following: Start ozempic 0.25 mg/week for 4 weeks->0.5 mg/week for 4 week-> 1 mg/week for 4 weeks  Previously tolerated ozempic 1 mg without S/E   No known contraindications/side effects to any of above medications No history of MEN syndrome/medullary thyroid cancer/pancreatitis or pancreatic cancer in self or family   -Last LD and Tg are as follows: Lab Results  Component Value Date   LDLCALC 118 (H) 07/16/2023    Lab Results  Component Value Date   TRIG 380 (H) 07/16/2023   -not on statin -resume rosuvastatin 20 mg every day   -Follow low fat diet and exercise   -Blood pressure goal <140/90 - Microalbumin/creatinine goal is < 30 -Last MA/Cr is as follows: Lab Results  Component Value Date   MICROALBUR 1.2 07/16/2023   -stopped losartan - on ACE/ARB  -diet changes including salt restriction -limit eating outside -counseled BP targets per standards of diabetes care -uncontrolled blood pressure can lead to retinopathy, nephropathy and cardiovascular and atherosclerotic heart disease  Reviewed and counseled on: -A1C target -Blood sugar targets -Complications of uncontrolled diabetes  -Checking blood sugar before meals and bedtime and bring log next  visit -All medications with mechanism of action and side effects -Hypoglycemia management: rule of 15's, Glucagon Emergency Kit and medical alert ID -low-carb low-fat plate-method diet -At least 20 minutes of physical activity per day -Annual dilated retinal eye exam and foot exam -compliance and follow up needs -follow up as scheduled or earlier if problem gets worse  Call if blood sugar is less than 70 or consistently above 250    Take a 15 gm snack of carbohydrate at bedtime before you go to sleep if your blood sugar is less than 100.    If you are going to fast after midnight for a test or procedure, ask your physician for instructions on how to reduce/decrease your insulin dose.    Call if blood sugar is less than 70 or consistently above 250  -Treating a low sugar by rule of 15  (15 gms of sugar every 15 min until sugar is more than 70) If you feel your sugar is low, test your sugar to be sure If your sugar is low (less than 70), then take 15 grams of a fast acting Carbohydrate (3-4 glucose tablets or glucose gel or 4 ounces of juice or regular soda) Recheck your sugar 15 min after treating low to make sure it is more than 70 If sugar is still less than 70, treat again with 15 grams of carbohydrate          Don't drive the hour of hypoglycemia  If unconscious/unable to eat or drink by mouth, use glucagon injection or nasal spray baqsimi and call 911. Can repeat again in  15 min if still unconscious.  No follow-ups on file.   I have reviewed current medications, nurse's notes, allergies, vital signs, past medical and surgical history, family medical history, and social history for this encounter. Counseled patient on symptoms, examination findings, lab findings, imaging results, treatment decisions and monitoring and prognosis. The patient understood the recommendations and agrees with the treatment plan. All questions regarding treatment plan were fully answered.  Allison Jena, MD   07/19/23    History of Present Illness Allison Potts is a 63 y.o. year old female who presents for evaluation of pre-diabetes. Marland Kitchen  Lashawnna Lambrecht was first diagnosed in 2023.   Diabetes education +  Stopped ozempic and farxiga to focus on lifestyle   Home diabetes regimen: none  COMPLICATIONS -  MI/Stroke -  retinopathy + neuropathy (carpal tunnel) -  nephropathy  SYMPTOMS REVIEWED + Polyuria (drinks a lot of water) - Weight loss - Blurred vision  BLOOD SUGAR DATA Checks sugar at home once a day   Physical Exam  BP (!) 140/80   Pulse 75   Ht 5\' 3"  (1.6 m)   Wt 225 lb (102.1 kg)   SpO2 98%   BMI 39.86 kg/m    Constitutional: well developed, well nourished Head: normocephalic, atraumatic Eyes: sclera anicteric, no redness Neck: supple Lungs: normal respiratory effort Neurology: alert and oriented Skin: dry, no appreciable rashes Musculoskeletal: no appreciable defects Psychiatric: normal mood and affect Diabetic Foot Exam - Simple   No data filed      Current Medications Patient's Medications  New Prescriptions   ROSUVASTATIN (CRESTOR) 10 MG TABLET    Take 2 tablets (20 mg total) by mouth daily.   SEMAGLUTIDE,0.25 OR 0.5MG /DOS, 2 MG/3ML SOPN    Inject 0.25 mg into the skin once a week.  Previous Medications   BUPROPION (WELLBUTRIN XL) 300 MG 24 HR TABLET    Take 300 mg by mouth daily.   FLUTICASONE (FLONASE) 50 MCG/ACT NASAL SPRAY    Place 2 sprays into both nostrils daily as needed for allergies.   GABAPENTIN (NEURONTIN) 300 MG CAPSULE    Take 300 mg by mouth 2 (two) times daily.   LEVOCETIRIZINE (XYZAL) 5 MG TABLET    Take 5 mg by mouth at bedtime.   LOSARTAN (COZAAR) 100 MG TABLET    Take 100 mg by mouth daily.   METFORMIN (GLUCOPHAGE-XR) 750 MG 24 HR TABLET    Take 750 mg by mouth every morning.   METHOCARBAMOL (ROBAXIN) 500 MG TABLET    Take 1 tablet (500 mg total) by mouth every 8 (eight) hours as needed for muscle spasms.   METOPROLOL TARTRATE  (LOPRESSOR) 25 MG TABLET    Take 1 tablet (25 mg total) by mouth 2 (two) times daily.   MULTIPLE VITAMINS-MINERALS (MULTIVITAMIN ADULT) CHEW    Chew 1 each by mouth daily.   NAPROXEN (NAPROSYN) 500 MG TABLET    Take 1 tablet (500 mg total) by mouth 2 (two) times daily as needed (pain).   QUETIAPINE (SEROQUEL) 50 MG TABLET    Take 50 mg by mouth at bedtime.   TAMSULOSIN (FLOMAX) 0.4 MG CAPS CAPSULE    Take 0.4 mg by mouth daily.  Modified Medications   No medications on file  Discontinued Medications   No medications on file    Allergies Allergies  Allergen Reactions   Penicillins Hives, Anaphylaxis and Rash    Passing out, lumps and bumps, was given epi Other reaction(s): Hives/Urticaria Trouble Breathing  Other reaction(s):  Hives/Urticaria Trouble Breathing  Trouble Breathing     Antihistamines, Diphenhydramine-Type     Pt does not want to take generic benedryl due to possible "side effects"    Past Medical History Past Medical History:  Diagnosis Date   Arthritis    Atrial fibrillation (HCC)    Fibromyalgia    Mental disorder    PID (acute pelvic inflammatory disease)    her 31s   Thyroid disease    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  Social History Social History   Socioeconomic History   Marital status: Married    Spouse name: Not on file   Number of children: Not on file   Years of education: Not on file   Highest education level: Not on file  Occupational History   Not on file  Tobacco Use   Smoking status: Never   Smokeless tobacco: Never  Vaping Use   Vaping status: Never Used  Substance and Sexual Activity   Alcohol use: Not Currently   Drug use: Not Currently   Sexual  activity: Yes    Birth control/protection: Surgical  Other Topics Concern   Not on file  Social History Narrative   Not on file   Social Drivers of Health   Financial Resource Strain: Low Risk  (05/25/2021)   Received from [facility] visits prior to 06/30/2022., [facility] visits prior to 06/30/2022.   Overall Financial Resource Strain (CARDIA)    Difficulty of Paying Living Expenses: Not very hard  Food Insecurity: No Food Insecurity (02/26/2023)   Hunger Vital Sign    Worried About Running Out of Food in the Last Year: Never true    Ran Out of Food in the Last Year: Never true  Transportation Needs: No Transportation Needs (05/25/2021)   Received from [facility] visits prior to 06/30/2022., [facility] visits prior to 06/30/2022.   PRAPARE - Administrator, Civil Service (Medical): No    Lack of Transportation (Non-Medical): No  Physical Activity: Insufficiently Active (05/25/2021)   Received from [facility] visits prior to 06/30/2022., [facility] visits prior to 06/30/2022.   Exercise Vital Sign    Days of Exercise per Week: 5 days    Minutes of Exercise per Session: 20 min  Stress: Stress Concern Present (05/25/2021)   [facility] visits prior to 06/30/2022., [facility] visits prior to 06/30/2022.   [employer] of Occupational Health - Occupational Stress Questionnaire    Feeling of Stress : Very much  Social Connections: Unknown (11/30/2022)   Received from [facility]   Social Network    Social Network: Not on file  Intimate Partner Violence: Unknown (11/30/2022)   Received from [facility]   HITS    Physically Hurt: Not on file    Insult or Talk Down To: Not on file    Threaten Physical Harm: Not on file    Scream or Curse: Not on file    Lab Results  Component Value Date    HGBA1C 6.1 (H) 07/16/2023  HGBA1C 5.7 (H) 08/02/2021   Lab Results  Component Value Date   CHOL 221 (H) 07/16/2023   Lab Results  Component Value Date   HDL 49 (L) 07/16/2023   Lab Results  Component Value Date   LDLCALC 118 (H) 07/16/2023   Lab Results  Component Value Date   TRIG 380 (H) 07/16/2023   Lab Results  Component Value Date   CHOLHDL 4.5 07/16/2023   Lab Results  Component Value Date   CREATININE 0.68 07/16/2023   No results found for: "GFR" Lab Results  Component Value Date   MICROALBUR 1.2 07/16/2023      Component Value Date/Time   NA 140 07/16/2023 0942   K 4.3 07/16/2023 0942   CL 103 07/16/2023 0942   CO2 28 07/16/2023 0942   GLUCOSE 87 07/16/2023 0942   BUN 17 07/16/2023 0942   CREATININE 0.68 07/16/2023 0942   CALCIUM 9.2 07/16/2023 0942   PROT 6.6 07/16/2023 0942   AST 52 (H) 07/16/2023 0942   ALT 42 (H) 07/16/2023 0942   BILITOT 0.6 07/16/2023 0942   GFRNONAA >60 08/02/2021 0224      Latest Ref Rng & Units 07/16/2023    9:42 AM 11/23/2022    2:30 AM 08/02/2021    2:24 AM  BMP  Glucose 65 - 99 mg/dL 87  161  096   BUN 7 - 25 mg/dL 17  18  14    Creatinine 0.50 - 1.05 mg/dL 0.45  4.09  8.11   BUN/Creat Ratio 6 - 22 (calc) SEE NOTE:     Sodium 135 - 146 mmol/L 140  139  137   Potassium 3.5 - 5.3 mmol/L 4.3  4.0  3.3   Chloride 98 - 110 mmol/L 103  100  108   CO2 20 - 32 mmol/L 28   24   Calcium 8.6 - 10.4 mg/dL 9.2   8.3        Component Value Date/Time   WBC 7.2 08/02/2021 0224   RBC 4.32 08/02/2021 0224   HGB 13.6 11/23/2022 0230   HCT 40.0 11/23/2022 0230   PLT 271 08/02/2021 0224   MCV 88.9 08/02/2021 0224   MCH 29.9 08/02/2021 0224   MCHC 33.6 08/02/2021 0224   RDW 12.9 08/02/2021 0224     Parts of this note may have been dictated using voice recognition software. There may be variances in spelling and vocabulary which are unintentional. Not all errors are proofread. Please notify the Thereasa Parkin if any discrepancies are  noted or if the meaning of any statement is not clear.

## 2023-08-08 ENCOUNTER — Encounter: Payer: 59 | Admitting: Obstetrics and Gynecology

## 2023-08-08 ENCOUNTER — Ambulatory Visit: Admitting: Obstetrics and Gynecology

## 2023-08-08 ENCOUNTER — Encounter: Payer: Self-pay | Admitting: Obstetrics and Gynecology

## 2023-08-08 ENCOUNTER — Other Ambulatory Visit (HOSPITAL_COMMUNITY)
Admission: RE | Admit: 2023-08-08 | Discharge: 2023-08-08 | Disposition: A | Discharge status: Home / Self Care | Source: Ambulatory Visit | Attending: Obstetrics and Gynecology | Admitting: Obstetrics and Gynecology

## 2023-08-08 VITALS — BP 129/78 | Ht 63.0 in | Wt 222.4 lb

## 2023-08-08 DIAGNOSIS — Z1331 Encounter for screening for depression: Secondary | ICD-10-CM | POA: Diagnosis not present

## 2023-08-08 DIAGNOSIS — L299 Pruritus, unspecified: Secondary | ICD-10-CM

## 2023-08-08 DIAGNOSIS — Z01419 Encounter for gynecological examination (general) (routine) without abnormal findings: Secondary | ICD-10-CM

## 2023-08-08 DIAGNOSIS — Z1151 Encounter for screening for human papillomavirus (HPV): Secondary | ICD-10-CM | POA: Diagnosis not present

## 2023-08-08 DIAGNOSIS — Z780 Asymptomatic menopausal state: Secondary | ICD-10-CM | POA: Diagnosis not present

## 2023-08-08 DIAGNOSIS — Z124 Encounter for screening for malignant neoplasm of cervix: Secondary | ICD-10-CM

## 2023-08-08 DIAGNOSIS — R8290 Unspecified abnormal findings in urine: Secondary | ICD-10-CM | POA: Diagnosis not present

## 2023-08-08 DIAGNOSIS — R21 Rash and other nonspecific skin eruption: Secondary | ICD-10-CM | POA: Diagnosis not present

## 2023-08-08 LAB — POCT URINALYSIS DIP (DEVICE)
Bilirubin Urine: NEGATIVE
Glucose, UA: NEGATIVE mg/dL
Ketones, ur: NEGATIVE mg/dL
Nitrite: NEGATIVE
Protein, ur: NEGATIVE mg/dL
Specific Gravity, Urine: >=1.03 (ref 1.005–1.030)
Urobilinogen, UA: 0.2 mg/dL (ref 0.0–1.0)
pH: 6 (ref 5.0–8.0)

## 2023-08-08 MED ORDER — CLOTRIMAZOLE-BETAMETHASONE 1-0.05 % EX CREA
1.0000 | TOPICAL_CREAM | Freq: Two times a day (BID) | CUTANEOUS | 0 refills | Status: AC
Start: 2023-08-08 — End: ?

## 2023-08-08 NOTE — Progress Notes (Signed)
 ANNUAL EXAM Patient name: Allison Potts MRN [id number]  Date of birth: [birth year] Chief Complaint:   No chief complaint on file.  History of Present Illness:   Allison Potts is a 63 y.o. [phone] being seen today for a routine annual exam.  Current complaints: annual, groin itching, feels she smells like urine, "a little mountain on her left labia"  Discussed the use of AI scribe software for clinical note transcription with the patient, who gave verbal consent to proceed.  History of Present Illness Allison Potts is a 63 year old female who presents for an annual physical exam.  She has experienced a sensation of smelling urine and feeling wet after using the bathroom for the past two to three weeks, despite thorough drying. There are no episodes of urine leakage during sneezing, laughing, or coughing. She uses panty liners, changing them twice daily, but not due to odor. She has a history of white blood cells in her urine for the past 15-20 years but rarely experiences urinary tract infections, recalling only one instance in her lifetime.  She reports an itchy and burning sensation in the groin area, which began three weeks ago after wearing tights during a 30-40 minute walk. Despite showering immediately after her walks, she has noticed increased sweating in the area. The itchiness feels like 'little tiny cuts' when she applies soap and water. She has not tried any treatments for this issue yet.  She mentions a bump in the groin area, which she believes is growing slightly. It is not painful or bothersome at this time.  Her last Pap smear was in 2020 and was normal. She recalls an abnormal Pap smear about 8-10 years ago, which was related to a cyst that was removed. She has been married for 25 years and is currently sexually active without any pain during intercourse.    No LMP recorded. Patient is postmenopausal.   The pregnancy intention screening data noted above was reviewed.  Potential methods of contraception were discussed. The patient elected to proceed with No data recorded.   Last pap reports last was done in 2020 Last mammogram: [facility] and was told she needed repeat.  Last colonoscopy: not seen on file .      08/08/2023    2:30 PM 02/26/2023   11:56 AM  Depression screen PHQ 2/9  Decreased Interest 0 0  Down, Depressed, Hopeless  0  PHQ - 2 Score 0 0  Altered sleeping 0   Tired, decreased energy 0   Change in appetite 0   Feeling bad or failure about yourself  0   Trouble concentrating 0   Moving slowly or fidgety/restless 0   Suicidal thoughts 0   PHQ-9 Score 0         08/08/2023    2:30 PM  GAD 7 : Generalized Anxiety Score  Nervous, Anxious, on Edge 0  Control/stop worrying 0  Worry too much - different things 0  Trouble relaxing 0  Restless 0  Easily annoyed or irritable 0  Afraid - awful might happen 0  Total GAD 7 Score 0     Review of Systems:   Pertinent items are noted in HPI Denies any headaches, blurred vision, fatigue, shortness of breath, chest pain, abdominal pain, abnormal vaginal discharge/itching/odor/irritation, problems with periods, bowel movements, urination, or intercourse unless otherwise stated above. Pertinent History Reviewed:  Reviewed past medical,surgical, social and family history.  Reviewed problem list, medications and allergies. Physical  Assessment:   Vitals:   08/08/23 1425  BP: 129/78  Weight: 222 lb 6.4 oz (100.9 kg)  Height: 5\' 3"  (1.6 m)  There is no height or weight on file to calculate BMI.        Physical Examination:   General appearance - well appearing, and in no distress  Mental status - alert, oriented to person, place, and time  Psych:  She has a normal mood and affect  Skin - warm and dry, normal color, no suspicious lesions noted  Chest - effort normal, all lung fields clear to auscultation bilaterally  Heart - normal rate and regular  rhythm  Breasts - breasts appear normal, no suspicious masses, no skin or nipple changes or  axillary nodes  Abdomen - soft, nontender, nondistended, no masses or organomegaly  Pelvic -  VULVA: normal appearing vulva with no masses, tenderness or lesions   VAGINA: normal appearing vagina with normal color and discharge, benign appearing inclusion cyst CERVIX: normal appearing cervix without discharge or lesions, no CMT  Thin prep pap is done with HR HPV cotesting  UTERUS: uterus is felt to be normal size, shape, consistency and nontender   ADNEXA: No adnexal masses or tenderness noted.  Extremities:  No swelling or varicosities noted  Chaperone present for exam  No results found for this or any previous visit (from the past 24 hours).    Assessment & Plan:  Assessment and Plan Assessment & Plan Urinary Incontinence Recent urine odor and wet sensation without known leakage. No stress incontinence. Long-term white blood cells in urine without recurrent UTIs. - Analyze urine sample for infection.  Groin Itch and Irritation Three-week history of groin itch and burning likely due to sweating and friction. No prior treatment. - Prescribe topical cream with light steroid and antifungal. - Advise use of Aquaphor or Vaseline to reduce friction if area is dried out. - Recommend antifungal powder if needed.  Inclusion Cyst Benign inclusion cyst in groin area. No pain or significant growth. Prefers no intervention unless bothersome or infected. - Monitor cyst for changes in size, pain, or redness.  General Health Maintenance Annual exam conducted. Last Pap smear in 2020 was normal. Discussed option to discontinue Pap smears after age 92 if no new risk factors. - Discuss option to discontinue Pap smears after age 52 if no new risk factors. - Cervical cancer screening: Discussed guidelines. Pap with HPV collected - Climacteric/Sexual health: Reviewed typical and atypical symptoms of  menopause/peri-menopause. Discussed PMB and to call if any amount of spotting.  - Colonoscopy: Per PCP - F/U 12 months and prn   Orders Placed This Encounter  Procedures   Culture, OB Urine   Urine Culture, OB Reflex   POCT urinalysis dip (device)    Meds:  Meds ordered this encounter  Medications   clotrimazole-betamethasone (LOTRISONE) cream    Sig: Apply 1 Application topically 2 (two) times daily.    Dispense:  30 g    Refill:  0    Follow-up: No follow-ups on file.  Kiki Pelton, MD 08/08/2023 2:28 PM

## 2023-08-12 ENCOUNTER — Encounter: Payer: Self-pay | Admitting: Obstetrics and Gynecology

## 2023-08-12 ENCOUNTER — Other Ambulatory Visit: Payer: Self-pay | Admitting: Obstetrics and Gynecology

## 2023-08-12 ENCOUNTER — Encounter: Payer: 59 | Admitting: Obstetrics and Gynecology

## 2023-08-12 DIAGNOSIS — B3731 Acute candidiasis of vulva and vagina: Secondary | ICD-10-CM

## 2023-08-12 DIAGNOSIS — N3090 Cystitis, unspecified without hematuria: Secondary | ICD-10-CM

## 2023-08-12 LAB — URINE CULTURE, OB REFLEX

## 2023-08-12 LAB — CULTURE, OB URINE

## 2023-08-12 MED ORDER — FLUCONAZOLE 150 MG PO TABS
150.0000 mg | ORAL_TABLET | Freq: Once | ORAL | 1 refills | Status: AC
Start: 2023-08-12 — End: 2023-08-12

## 2023-08-12 MED ORDER — NITROFURANTOIN MONOHYD MACRO 100 MG PO CAPS
100.0000 mg | ORAL_CAPSULE | Freq: Two times a day (BID) | ORAL | 0 refills | Status: DC
Start: 2023-08-12 — End: 2023-12-06

## 2023-08-15 ENCOUNTER — Encounter: Payer: Self-pay | Admitting: Obstetrics and Gynecology

## 2023-08-15 LAB — CYTOLOGY - PAP
Adequacy: ABSENT
Comment: NEGATIVE
Diagnosis: NEGATIVE
High risk HPV: NEGATIVE

## 2023-09-26 ENCOUNTER — Other Ambulatory Visit: Payer: Self-pay | Admitting: Registered Nurse

## 2023-09-26 DIAGNOSIS — R928 Other abnormal and inconclusive findings on diagnostic imaging of breast: Secondary | ICD-10-CM

## 2023-10-01 ENCOUNTER — Other Ambulatory Visit: Payer: Self-pay | Admitting: Registered Nurse

## 2023-10-01 DIAGNOSIS — N6320 Unspecified lump in the left breast, unspecified quadrant: Secondary | ICD-10-CM

## 2023-10-28 ENCOUNTER — Other Ambulatory Visit: Payer: Self-pay | Admitting: Registered Nurse

## 2023-10-28 ENCOUNTER — Ambulatory Visit
Admission: RE | Admit: 2023-10-28 | Discharge: 2023-10-28 | Disposition: A | Discharge status: Home / Self Care | Source: Ambulatory Visit | Attending: Registered Nurse | Admitting: Registered Nurse

## 2023-10-28 ENCOUNTER — Ambulatory Visit
Admission: RE | Admit: 2023-10-28 | Discharge: 2023-10-28 | Disposition: A | Discharge status: Home / Self Care | Source: Ambulatory Visit | Attending: Registered Nurse

## 2023-10-28 DIAGNOSIS — R599 Enlarged lymph nodes, unspecified: Secondary | ICD-10-CM

## 2023-10-28 DIAGNOSIS — N6320 Unspecified lump in the left breast, unspecified quadrant: Secondary | ICD-10-CM

## 2023-10-29 DIAGNOSIS — C801 Malignant (primary) neoplasm, unspecified: Secondary | ICD-10-CM

## 2023-10-29 HISTORY — DX: Malignant (primary) neoplasm, unspecified: C80.1

## 2023-11-05 ENCOUNTER — Other Ambulatory Visit: Payer: Self-pay | Admitting: "Endocrinology

## 2023-11-05 NOTE — Telephone Encounter (Signed)
 Requested Prescriptions   Pending Prescriptions Disp Refills   OZEMPIC , 0.25 OR 0.5 MG/DOSE, 2 MG/3ML SOPN [Pharmacy Med Name: Ozempic  (0.25 or 0.5 MG/DOSE) 2 MG/3ML Subcutaneous Solution Pen-injector] 3 mL 0    Sig: INJECT 0.25 MG INTO THE SKIN ONCE A WEEK

## 2023-11-11 ENCOUNTER — Encounter: Payer: Self-pay | Admitting: Registered Nurse

## 2023-11-12 ENCOUNTER — Ambulatory Visit
Admission: RE | Admit: 2023-11-12 | Discharge: 2023-11-12 | Disposition: A | Discharge status: Home / Self Care | Source: Ambulatory Visit | Attending: Registered Nurse

## 2023-11-12 ENCOUNTER — Ambulatory Visit
Admission: RE | Admit: 2023-11-12 | Discharge: 2023-11-12 | Disposition: A | Discharge status: Home / Self Care | Source: Ambulatory Visit | Attending: Registered Nurse | Admitting: Registered Nurse

## 2023-11-12 DIAGNOSIS — N6320 Unspecified lump in the left breast, unspecified quadrant: Secondary | ICD-10-CM

## 2023-11-12 DIAGNOSIS — R599 Enlarged lymph nodes, unspecified: Secondary | ICD-10-CM

## 2023-11-12 HISTORY — PX: BREAST BIOPSY: SHX20

## 2023-11-13 LAB — SURGICAL PATHOLOGY

## 2023-11-14 ENCOUNTER — Telehealth: Payer: Self-pay | Admitting: *Deleted

## 2023-11-14 NOTE — Telephone Encounter (Signed)
 Spoke to patient to confirm upcoming morning [facility] clinic appointment on 7/23, paperwork will be sent via email.  Gave location and time, also informed patient that the surgeon's office would be calling as well to get information from them similar to the packet that they will be receiving so make sure to do both.  Reminded patient that all providers will be coming to the clinic to see them HERE and if they had any questions to not hesitate to reach back out to myself or their navigators.

## 2023-11-18 ENCOUNTER — Encounter: Payer: Self-pay | Admitting: *Deleted

## 2023-11-18 DIAGNOSIS — Z170 Estrogen receptor positive status [ER+]: Secondary | ICD-10-CM | POA: Insufficient documentation

## 2023-11-19 ENCOUNTER — Telehealth (HOSPITAL_COMMUNITY): Payer: Self-pay

## 2023-11-19 NOTE — Progress Notes (Signed)
 Radiation Oncology         [phone] ________________________________  Multidisciplinary Breast Oncology Clinic [facility]) Initial Outpatient Consultation  Name: Allison Potts MRN: [id number]  Date: 11/20/2023  DOB: 1960-05-03  RR:Whlbzw, Luke, NP  Vernetta Berg, MD   REFERRING PHYSICIAN: Vernetta Berg, MD  DIAGNOSIS: There were no encounter diagnoses.  Stage *** Left Breast UOQ, Invasive and in situ ductal carcinoma, ER+ / PR+ / Her2-, Grade 2  No diagnosis found.  HISTORY OF PRESENT ILLNESS::Allison Potts is a 63 y.o. female who is presenting to the office today for evaluation of her newly diagnosed breast cancer. She is accompanied by ***. She is doing well overall.   She had routine screening mammography on 07/11/23 showing a possible abnormality in the left breast. She underwent a left breast diagnostic mammography with tomography and left breast ultrasonography at [facility] on 10/28/23 showing: a suspicious 2.6 cm mass in the 2 o'clock left breast located 4 cmfn, and an indeterminate left axillary lymph node with a cortical thickness of approximately 4 mm.   Biopsy of the 2 o'clock left breast mass on 11/12/22 showed: grade 2 invasive ductal carcinoma measuring 1.0 cm in the greatest linear extent of the sample with intermediate grade DCIS. Prognostic indicators significant for: estrogen receptor, 100% positive and progesterone receptor, 100% positive, both with strong staining intensity. Proliferation marker Ki67 at 5%. HER2 negative. The indeterminate left axillary lymph node was also examined and showed no evidence of malignancy and findings consistent with a benign lymph node.   Menarche: *** years old Age at first live birth: *** years old GP: *** LMP: *** Contraceptive: *** HRT: ***   The patient was referred today for presentation in the multidisciplinary conference.  Radiology studies and pathology slides were presented there for review and discussion of  treatment options.  A consensus was discussed regarding potential next steps.  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    PAST SURGICAL HISTORY: Past Surgical History:  Procedure Laterality Date   APPENDECTOMY     BREAST BIOPSY Left 11/12/2023   US  LT BREAST BX W LOC DEV 1ST LESION IMG BX SPEC US  GUIDE 11/12/2023 [facility] MAMMOGRAPHY   THYROID  LOBECTOMY     TUBAL LIGATION      FAMILY HISTORY:  Family History  Problem Relation Age of Onset   Diabetes Mother    Heart disease Mother    Emphysema Mother    Heart disease Father    Diabetes Father     SOCIAL HISTORY:  Social History   Socioeconomic History   Marital status: Married    Spouse name: Not on file   Number of children: Not on file   Years of education: Not on file   Highest education level: Not on file  Occupational History   Not on file  Tobacco Use   Smoking status: Never   Smokeless tobacco: Never  Vaping Use   Vaping status: Never Used  Substance and Sexual Activity   Alcohol use: Not Currently   Drug use: Not Currently   Sexual activity: Yes    Birth control/protection: Surgical  Other Topics Concern   Not on file  Social History Narrative  Not on file   Social Drivers of Health   Financial Resource Strain: Low Risk  (05/25/2021)   Received from [facility] prior to 06/30/2022.   Overall Financial Resource Strain (CARDIA)    Difficulty of Paying Living Expenses: Not very hard  Food Insecurity: No Food Insecurity (08/08/2023)   Hunger Vital Sign    Worried About Running Out of Food in the Last Year: Never true    Ran Out of Food in the Last Year: Never true  Transportation Needs: No Transportation Needs (08/08/2023)   PRAPARE -  Administrator, Civil Service (Medical): No    Lack of Transportation (Non-Medical): No  Physical Activity: Insufficiently Active (05/25/2021)   Received from [facility] visits prior to 06/30/2022.   Exercise Vital Sign    On average, how many days per week do you engage in moderate to strenuous exercise (like a brisk walk)?: 5 days    On average, how many minutes do you engage in exercise at this level?: 20 min  Stress: Stress Concern Present (05/25/2021)   Received from [facility] visits prior to 06/30/2022.   [employer] of Occupational Health - Occupational Stress Questionnaire    Feeling of Stress : Very much  Social Connections: Unknown (11/30/2022)   Received from [employer]   Social Network    Social Network: Not on file    ALLERGIES:  Allergies  Allergen Reactions   Penicillins Hives, Anaphylaxis and Rash    Passing out, lumps and bumps, was given epi Other reaction(s): Hives/Urticaria Trouble Breathing  Other reaction(s): Hives/Urticaria Trouble Breathing  Trouble Breathing     Antihistamines, Diphenhydramine-Type     Pt does not want to take generic benedryl due to possible side effects    MEDICATIONS:  Current Outpatient Medications  Medication Sig Dispense Refill   buPROPion  (WELLBUTRIN  XL) 300 MG 24 hr tablet Take 300 mg by mouth daily.     clotrimazole -betamethasone  (LOTRISONE ) cream Apply 1 Application topically 2 (two) times daily. 30 g 0   fluticasone  (FLONASE ) 50 MCG/ACT nasal spray Place 2 sprays into both nostrils daily as needed for allergies.     gabapentin  (NEURONTIN ) 300 MG capsule Take 300 mg by mouth 2 (two) times daily.     levocetirizine (XYZAL) 5 MG tablet Take 5 mg by mouth at bedtime.     losartan  (COZAAR ) 100 MG tablet Take 100 mg by mouth daily.     metFORMIN (GLUCOPHAGE-XR) 750 MG 24 hr tablet Take 750 mg by mouth every morning.     methocarbamol  (ROBAXIN ) 500 MG tablet Take 1  tablet (500 mg total) by mouth every 8 (eight) hours as needed for muscle spasms. 15 tablet 0   metoprolol  tartrate (LOPRESSOR ) 25 MG tablet Take 1 tablet (25 mg total) by mouth 2 (two) times daily. 60 tablet 0   Multiple Vitamins-Minerals (MULTIVITAMIN ADULT) CHEW Chew 1 each by mouth daily.     naproxen  (NAPROSYN ) 500 MG tablet Take 1 tablet (500 mg total) by mouth 2 (two) times daily as needed (pain). 30 tablet 0   nitrofurantoin , macrocrystal-monohydrate, (MACROBID ) 100 MG capsule Take 1 capsule (100 mg total) by mouth 2 (two) times daily. 10 capsule 0   OZEMPIC , 0.25 OR 0.5 MG/DOSE, 2 MG/3ML SOPN INJECT 0.25 MG INTO THE SKIN ONCE A WEEK 3 mL 0   QUEtiapine  (SEROQUEL ) 50 MG tablet Take 50 mg by mouth at bedtime.     rosuvastatin  (  CRESTOR ) 10 MG tablet Take 2 tablets (20 mg total) by mouth daily. 90 tablet 1   No current facility-administered medications for this encounter.    REVIEW OF SYSTEMS: A 10+ POINT REVIEW OF SYSTEMS WAS OBTAINED including neurology, dermatology, psychiatry, cardiac, respiratory, lymph, extremities, GI, GU, musculoskeletal, constitutional, reproductive, HEENT. On the provided form, she reports ***. She denies *** and any other symptoms.    PHYSICAL EXAM:  vitals were not taken for this visit.  {may need to copy over vitals} Lungs are clear to auscultation bilaterally. Heart has regular rate and rhythm. No palpable cervical, supraclavicular, or axillary adenopathy. Abdomen soft, non-tender, normal bowel sounds. Breast: Right breast with no palpable mass, nipple discharge, or bleeding. Left breast with ***.   KPS = ***  100 - Normal; no complaints; no evidence of disease. 90   - Able to carry on normal activity; minor signs or symptoms of disease. 80   - Normal activity with effort; some signs or symptoms of disease. 40   - Cares for self; unable to carry on normal activity or to do active work. 60   - Requires occasional assistance, but is able to care for most of  his personal needs. 50   - Requires considerable assistance and frequent medical care. 40   - Disabled; requires special care and assistance. 30   - Severely disabled; hospital admission is indicated although death not imminent. 20   - Very sick; hospital admission necessary; active supportive treatment necessary. 10   - Moribund; fatal processes progressing rapidly. 0     - Dead  Karnofsky DA, Abelmann WH, Craver LS and Burchenal [facility] [phone]) The use of the nitrogen mustards in the palliative treatment of carcinoma: with particular reference to bronchogenic carcinoma Cancer 1 634-56  LABORATORY DATA:  Lab Results  Component Value Date   WBC 7.2 08/02/2021   HGB 13.6 11/23/2022   HCT 40.0 11/23/2022   MCV 88.9 08/02/2021   PLT 271 08/02/2021   Lab Results  Component Value Date   NA 140 07/16/2023   K 4.3 07/16/2023   CL 103 07/16/2023   CO2 28 07/16/2023   Lab Results  Component Value Date   ALT 42 (H) 07/16/2023   AST 52 (H) 07/16/2023   BILITOT 0.6 07/16/2023    PULMONARY FUNCTION TEST:   Review Flowsheet        No data to display          RADIOGRAPHY: US  LT BREAST BX W LOC DEV 1ST LESION IMG BX SPEC US  GUIDE Addendum Date: 11/19/2023 ADDENDUM REPORT: 11/19/2023 07:28 ADDENDUM: Pathology revealed GRADE II INVASIVE DUCTAL CARCINOMA, DUCTAL CARCINOMA IN SITU, CRIBRIFORM TYPE, INTERMEDIATE NUCLEAR GRADE of the LEFT breast, 2 o'clock, 4cmfn, (coil clip). This was found to be concordant by Dr. Alm Parkins. Pathology revealed BENIGN LYMPHOID TISSUE CONSISTENT WITH A LYMPHOID NODE, NEGATIVE FOR METASTATIC CARCINOMA of the LEFT axilla, (hydromark spiral clip) clip). This was found to be concordant by Dr. Alm Parkins. Pathology results were discussed with the patient by telephone. The patient reported doing well after the biopsies with tenderness at the sites. Post biopsy instructions and care were reviewed and questions were answered. The patient was encouraged to call [facility] for any additional concerns. My direct phone number was provided. The patient was referred to [facility] on November 20, 2023. Pathology results reported by Hendricks Benders, RN on 11/19/2023. Electronically  Signed   By: Alm Parkins M.D.   On: 11/19/2023 07:28   Result Date: 11/19/2023 CLINICAL DATA:  Patient presents for ultrasound-guided core needle biopsy of a left breast mass and a left axillary lymph node. EXAM: ULTRASOUND GUIDED LEFT BREAST CORE NEEDLE BIOPSY ULTRASOUND-GUIDED LEFT AXILLARY LYMPH NODE CORE NEEDLE BIOPSY COMPARISON:  Previous exam(s). PROCEDURE: I met with the patient and we discussed the procedure of ultrasound-guided biopsy, including benefits and alternatives. We discussed the high likelihood of a successful procedure. We discussed the risks of the procedure, including infection, bleeding, tissue injury, clip migration, and inadequate sampling. Informed written consent was given. The usual time-out protocol was performed immediately prior to the procedure. Biopsy #1: Left breast mass at 2 o'clock. Lesion quadrant: Upper outer quadrant Using sterile technique and 1% Lidocaine  as local anesthetic, under direct ultrasound visualization, a 12 gauge spring-loaded device was used to perform biopsy of the left breast mass at 2 o'clock using an inferior approach. At the conclusion of the procedure a coil shaped tissue marker clip was deployed into the biopsy cavity. Biopsy #1: Left axillary lymph node. Lesion location: Left axilla Using sterile technique and 1% Lidocaine  as local anesthetic, under direct ultrasound visualization, a 14 gauge spring-loaded device was used to perform biopsy of the left axillary lymph node using an inferior approach. At the conclusion of the procedure a HydroMARK, spiral shaped tissue marker clip was deployed into the biopsy cavity. Follow up 2 view mammogram was  performed and dictated separately. IMPRESSION: Ultrasound guided biopsy of a left breast mass. No apparent complications. Ultrasound guided biopsy of a left axillary lymph node. No apparent complications. Electronically Signed: By: Alm Parkins M.D. On: 11/12/2023 09:44   US  AXILLARY NODE CORE BIOPSY LEFT Addendum Date: 11/19/2023 ADDENDUM REPORT: 11/19/2023 07:28 ADDENDUM: Pathology revealed GRADE II INVASIVE DUCTAL CARCINOMA, DUCTAL CARCINOMA IN SITU, CRIBRIFORM TYPE, INTERMEDIATE NUCLEAR GRADE of the LEFT breast, 2 o'clock, 4cmfn, (coil clip). This was found to be concordant by Dr. Alm Parkins. Pathology revealed BENIGN LYMPHOID TISSUE CONSISTENT WITH A LYMPHOID NODE, NEGATIVE FOR METASTATIC CARCINOMA of the LEFT axilla, (hydromark spiral clip) clip). This was found to be concordant by Dr. Alm Parkins. Pathology results were discussed with the patient by telephone. The patient reported doing well after the biopsies with tenderness at the sites. Post biopsy instructions and care were reviewed and questions were answered. The patient was encouraged to call [facility] for any additional concerns. My direct phone number was provided. The patient was referred to [facility] on November 20, 2023. Pathology results reported by Hendricks Benders, RN on 11/19/2023. Electronically Signed   By: Alm Parkins M.D.   On: 11/19/2023 07:28   Result Date: 11/19/2023 CLINICAL DATA:  Patient presents for ultrasound-guided core needle biopsy of a left breast mass and a left axillary lymph node. EXAM: ULTRASOUND GUIDED LEFT BREAST CORE NEEDLE BIOPSY ULTRASOUND-GUIDED LEFT AXILLARY LYMPH NODE CORE NEEDLE BIOPSY COMPARISON:  Previous exam(s). PROCEDURE: I met with the patient and we discussed the procedure of ultrasound-guided biopsy, including benefits and alternatives. We discussed the high likelihood of a successful procedure. We  discussed the risks of the procedure, including infection, bleeding, tissue injury, clip migration, and inadequate sampling. Informed written consent was given. The usual time-out protocol was performed immediately prior to the procedure. Biopsy #1: Left breast mass at 2 o'clock. Lesion quadrant: Upper outer quadrant Using sterile technique and 1% Lidocaine   as local anesthetic, under direct ultrasound visualization, a 12 gauge spring-loaded device was used to perform biopsy of the left breast mass at 2 o'clock using an inferior approach. At the conclusion of the procedure a coil shaped tissue marker clip was deployed into the biopsy cavity. Biopsy #1: Left axillary lymph node. Lesion location: Left axilla Using sterile technique and 1% Lidocaine  as local anesthetic, under direct ultrasound visualization, a 14 gauge spring-loaded device was used to perform biopsy of the left axillary lymph node using an inferior approach. At the conclusion of the procedure a HydroMARK, spiral shaped tissue marker clip was deployed into the biopsy cavity. Follow up 2 view mammogram was performed and dictated separately. IMPRESSION: Ultrasound guided biopsy of a left breast mass. No apparent complications. Ultrasound guided biopsy of a left axillary lymph node. No apparent complications. Electronically Signed: By: Alm Parkins M.D. On: 11/12/2023 09:44   MM CLIP PLACEMENT LEFT Result Date: 11/12/2023 CLINICAL DATA:  Assess post biopsy marker clip placements following ultrasound-guided core needle biopsy of a left breast mass and a left axillary lymph node. EXAM: 3D DIAGNOSTIC LEFT MAMMOGRAM POST ULTRASOUND BIOPSY COMPARISON:  Previous exam(s). ACR Breast Density Category b: There are scattered areas of fibroglandular density. FINDINGS: 3D Mammographic images were obtained following ultrasound guided biopsy of a left breast mass and a left axillary lymph node. The coil shaped biopsy marking clip is in expected position at the site  of biopsy within the mass in the upper outer breast. The spiral HydroMARK clip is seen within a lymph node in the left axilla. IMPRESSION: Appropriate positioning of the coil shaped and HydroMARK spiral shaped biopsy marking clip at the site of biopsy in the left breast and left axilla as detailed above. Final Assessment: Post Procedure Mammograms for Marker Placement Electronically Signed   By: Alm Parkins M.D.   On: 11/12/2023 10:17   MM 3D DIAGNOSTIC MAMMOGRAM UNILATERAL LEFT BREAST Result Date: 10/28/2023 CLINICAL DATA:  Recall for LEFT breast mass. EXAM: DIGITAL DIAGNOSTIC UNILATERAL LEFT MAMMOGRAM WITH TOMOSYNTHESIS AND CAD; ULTRASOUND LEFT BREAST LIMITED TECHNIQUE: Left digital diagnostic mammography and breast tomosynthesis was performed. The images were evaluated with computer-aided detection. ; Targeted ultrasound examination of the left breast was performed. COMPARISON:  Previous exam(s). ACR Breast Density Category b: There are scattered areas of fibroglandular density. FINDINGS: LEFT: Mammogram: Irregularly-shaped spiculated mass is seen in the middle to posterior depth of the outer LEFT breast. Ultrasound: Targeted sonographic evaluation of the LEFT breast demonstrates an irregular hypoechoic mass with an echogenic rind measuring 2.6 x 1.3 x 1.1 cm at 2 o'clock 4 CMFN, corresponding to the mammographic mass. Two axillary lymph nodes are seen with mildly prominent cortex, one measuring 3 mm and the other measuring 4 mm. IMPRESSION: 1. Suspicious 2.6 cm LEFT breast mass (2 o'clock 4 CMFN). Recommend ultrasound-guided core needle biopsy. 2. Indeterminate LEFT axillary lymph node with cortical thickness of 4 mm. Recommend ultrasound-guided core needle biopsy. RECOMMENDATION: 1. Ultrasound-guided core needle biopsy of 2.6 cm LEFT breast mass (2 o'clock 4 CMFN). 2. Ultrasound-guided biopsy of LEFT axillary lymph node with cortical thickness of 4 mm. I have discussed the findings and recommendations with  the patient. The biopsy procedure was explained to the patient and questions were answered. Patient expressed their understanding of the biopsy recommendation. Patient will be scheduled for biopsy at her earliest convenience by the schedulers. Ordering provider will be notified. If applicable, a reminder letter will be sent to the patient regarding the next appointment. BI-RADS  CATEGORY  5: Highly suggestive of malignancy. Electronically Signed   By: Aliene Lloyd M.D.   On: 10/28/2023 11:15   US  LIMITED ULTRASOUND INCLUDING AXILLA LEFT BREAST  Result Date: 10/28/2023 CLINICAL DATA:  Recall for LEFT breast mass. EXAM: DIGITAL DIAGNOSTIC UNILATERAL LEFT MAMMOGRAM WITH TOMOSYNTHESIS AND CAD; ULTRASOUND LEFT BREAST LIMITED TECHNIQUE: Left digital diagnostic mammography and breast tomosynthesis was performed. The images were evaluated with computer-aided detection. ; Targeted ultrasound examination of the left breast was performed. COMPARISON:  Previous exam(s). ACR Breast Density Category b: There are scattered areas of fibroglandular density. FINDINGS: LEFT: Mammogram: Irregularly-shaped spiculated mass is seen in the middle to posterior depth of the outer LEFT breast. Ultrasound: Targeted sonographic evaluation of the LEFT breast demonstrates an irregular hypoechoic mass with an echogenic rind measuring 2.6 x 1.3 x 1.1 cm at 2 o'clock 4 CMFN, corresponding to the mammographic mass. Two axillary lymph nodes are seen with mildly prominent cortex, one measuring 3 mm and the other measuring 4 mm. IMPRESSION: 1. Suspicious 2.6 cm LEFT breast mass (2 o'clock 4 CMFN). Recommend ultrasound-guided core needle biopsy. 2. Indeterminate LEFT axillary lymph node with cortical thickness of 4 mm. Recommend ultrasound-guided core needle biopsy. RECOMMENDATION: 1. Ultrasound-guided core needle biopsy of 2.6 cm LEFT breast mass (2 o'clock 4 CMFN). 2. Ultrasound-guided biopsy of LEFT axillary lymph node with cortical thickness of 4  mm. I have discussed the findings and recommendations with the patient. The biopsy procedure was explained to the patient and questions were answered. Patient expressed their understanding of the biopsy recommendation. Patient will be scheduled for biopsy at her earliest convenience by the schedulers. Ordering provider will be notified. If applicable, a reminder letter will be sent to the patient regarding the next appointment. BI-RADS CATEGORY  5: Highly suggestive of malignancy. Electronically Signed   By: Aliene Lloyd M.D.   On: 10/28/2023 11:15      IMPRESSION: ***   Patient will be a good candidate for breast conservation with radiotherapy to the left breast. We discussed the general course of radiation, potential side effects, and toxicities with radiation and the patient is interested in this approach. ***   PLAN:  *** (copy notes from board at conference)   ------------------------------------------------  Lynwood CHARM Nasuti, PhD, MD  This document serves as a record of services personally performed by Lynwood Nasuti, MD. It was created on his behalf by Dorthy Fuse, a trained medical scribe. The creation of this record is based on the scribe's personal observations and the provider's statements to them. This document has been checked and approved by the attending provider.

## 2023-11-20 ENCOUNTER — Other Ambulatory Visit: Payer: Self-pay | Admitting: Surgery

## 2023-11-20 ENCOUNTER — Inpatient Hospital Stay: Admitting: Genetic Counselor

## 2023-11-20 ENCOUNTER — Encounter: Payer: Self-pay | Admitting: Physical Therapy

## 2023-11-20 ENCOUNTER — Ambulatory Visit: Admitting: Podiatry

## 2023-11-20 ENCOUNTER — Other Ambulatory Visit: Payer: Self-pay

## 2023-11-20 ENCOUNTER — Encounter: Payer: Self-pay | Admitting: Genetic Counselor

## 2023-11-20 ENCOUNTER — Ambulatory Visit: Attending: Surgery | Admitting: Physical Therapy

## 2023-11-20 ENCOUNTER — Inpatient Hospital Stay: Admitting: Licensed Clinical Social Worker

## 2023-11-20 ENCOUNTER — Encounter: Payer: Self-pay | Admitting: *Deleted

## 2023-11-20 ENCOUNTER — Ambulatory Visit
Admission: RE | Admit: 2023-11-20 | Discharge: 2023-11-20 | Disposition: A | Discharge status: Home / Self Care | Source: Ambulatory Visit | Attending: Radiation Oncology | Admitting: Radiation Oncology

## 2023-11-20 ENCOUNTER — Inpatient Hospital Stay: Admitting: Hematology and Oncology

## 2023-11-20 ENCOUNTER — Inpatient Hospital Stay: Attending: Hematology and Oncology

## 2023-11-20 VITALS — BP 150/67 | HR 64 | Temp 98.1°F | Resp 18 | Ht 63.0 in | Wt 230.8 lb

## 2023-11-20 DIAGNOSIS — Z800 Family history of malignant neoplasm of digestive organs: Secondary | ICD-10-CM

## 2023-11-20 DIAGNOSIS — R293 Abnormal posture: Secondary | ICD-10-CM | POA: Diagnosis present

## 2023-11-20 DIAGNOSIS — Z79899 Other long term (current) drug therapy: Secondary | ICD-10-CM | POA: Insufficient documentation

## 2023-11-20 DIAGNOSIS — Z170 Estrogen receptor positive status [ER+]: Secondary | ICD-10-CM | POA: Diagnosis present

## 2023-11-20 DIAGNOSIS — Z1721 Progesterone receptor positive status: Secondary | ICD-10-CM | POA: Diagnosis not present

## 2023-11-20 DIAGNOSIS — E119 Type 2 diabetes mellitus without complications: Secondary | ICD-10-CM | POA: Insufficient documentation

## 2023-11-20 DIAGNOSIS — C50412 Malignant neoplasm of upper-outer quadrant of left female breast: Secondary | ICD-10-CM | POA: Insufficient documentation

## 2023-11-20 DIAGNOSIS — Z7985 Long-term (current) use of injectable non-insulin antidiabetic drugs: Secondary | ICD-10-CM | POA: Diagnosis not present

## 2023-11-20 DIAGNOSIS — Z803 Family history of malignant neoplasm of breast: Secondary | ICD-10-CM

## 2023-11-20 DIAGNOSIS — Z853 Personal history of malignant neoplasm of breast: Secondary | ICD-10-CM

## 2023-11-20 DIAGNOSIS — Z1732 Human epidermal growth factor receptor 2 negative status: Secondary | ICD-10-CM | POA: Diagnosis not present

## 2023-11-20 LAB — CBC WITH DIFFERENTIAL (CANCER CENTER ONLY)
Abs Immature Granulocytes: 0.04 K/uL (ref 0.00–0.07)
Basophils Absolute: 0.1 K/uL (ref 0.0–0.1)
Basophils Relative: 1 %
Eosinophils Absolute: 0.2 K/uL (ref 0.0–0.5)
Eosinophils Relative: 3 %
HCT: 39.3 % (ref 36.0–46.0)
Hemoglobin: 13.4 g/dL (ref 12.0–15.0)
Immature Granulocytes: 1 %
Lymphocytes Relative: 41 %
Lymphs Abs: 2.9 K/uL (ref 0.7–4.0)
MCH: 30.3 pg (ref 26.0–34.0)
MCHC: 34.1 g/dL (ref 30.0–36.0)
MCV: 88.9 fL (ref 80.0–100.0)
Monocytes Absolute: 0.5 K/uL (ref 0.1–1.0)
Monocytes Relative: 6 %
Neutro Abs: 3.4 K/uL (ref 1.7–7.7)
Neutrophils Relative %: 48 %
Platelet Count: 250 K/uL (ref 150–400)
RBC: 4.42 MIL/uL (ref 3.87–5.11)
RDW: 13.3 % (ref 11.5–15.5)
WBC Count: 7.1 K/uL (ref 4.0–10.5)
nRBC: 0 % (ref 0.0–0.2)

## 2023-11-20 LAB — CMP (CANCER CENTER ONLY)
ALT: 44 U/L (ref 0–44)
AST: 49 U/L — ABNORMAL HIGH (ref 15–41)
Albumin: 4 g/dL (ref 3.5–5.0)
Alkaline Phosphatase: 90 U/L (ref 38–126)
Anion gap: 4 — ABNORMAL LOW (ref 5–15)
BUN: 10 mg/dL (ref 8–23)
CO2: 30 mmol/L (ref 22–32)
Calcium: 8.7 mg/dL — ABNORMAL LOW (ref 8.9–10.3)
Chloride: 105 mmol/L (ref 98–111)
Creatinine: 0.56 mg/dL (ref 0.44–1.00)
GFR, Estimated: >60 mL/min (ref >60)
Glucose, Bld: 150 mg/dL — ABNORMAL HIGH (ref 70–99)
Potassium: 3.9 mmol/L (ref 3.5–5.1)
Sodium: 139 mmol/L (ref 135–145)
Total Bilirubin: 0.6 mg/dL (ref 0.0–1.2)
Total Protein: 6.8 g/dL (ref 6.5–8.1)

## 2023-11-20 LAB — GENETIC SCREENING ORDER

## 2023-11-20 NOTE — Assessment & Plan Note (Signed)
 Assessment and Plan Assessment & Plan Invasive ductal carcinoma of left breast Invasive ductal carcinoma, 2.6 cm, grade 2, ER/PR positive, HER2 negative, non-cancerous lymph nodes, low growth rate, high cure likelihood. - Schedule lumpectomy with Dr. Vernetta. - Perform oncotype testing post-surgery. Discussed about oncotype - Plan radiation therapy post-surgery if oncotype confirms no benefit from chemotherapy. - Initiate hormone therapy post-radiation. Discussed about tamoxifen vs aromatase inhibitors in detail. - Engage nurse navigator for home health support if needed.  Type 2 diabetes mellitus Type 2 diabetes managed with low-dose Ozempic , hold due to potential gastroparesis before surgery. - Hold Ozempic  prior to surgery. - Consult with Dr. Vernetta regarding Ozempic  management.

## 2023-11-20 NOTE — Progress Notes (Signed)
 [facility] Clinical Social Work  Initial Assessment   Allison Potts is a 63 y.o. year old female accompanied by daughter Jinnie and sister. Clinical Social Work was referred by [facility] for assessment of psychosocial needs.   SDOH (Social Determinants of Health) assessments performed: Yes SDOH Interventions    Flowsheet Row Clinical Support from 11/20/2023 in [facility] On[facility] - A Dept Of [place]. [facility] Office Visit from 08/08/2023 in [facility]  SDOH Interventions    Food Insecurity Interventions Intervention Not Indicated Intervention Not Indicated  Housing Interventions Intervention Not Indicated --  Transportation Interventions -- Intervention Not Indicated  Utilities Interventions Intervention Not Indicated --    SDOH Screenings   Food Insecurity: No Food Insecurity (11/20/2023)  Housing: Low Risk  (11/20/2023)  Transportation Needs: No Transportation Needs (08/08/2023)  Utilities: Not At Risk (11/20/2023)  Depression (PHQ2-9): Low Risk  (11/20/2023)  Financial Resource Strain: Low Risk  (05/25/2021)   Received from [facility] visits prior to 06/30/2022.  Physical Activity: Insufficiently Active (05/25/2021)   Received from [facility] visits prior to 06/30/2022.  Social Connections: Unknown (11/30/2022)   Received from [facility]  Stress: Stress Concern Present (05/25/2021)   Received from [facility] visits prior to 06/30/2022.  Tobacco Use: Low Risk  (11/20/2023)  Recent Concern: Tobacco Use - Medium Risk (10/17/2023)   [facility]     Distress Screen completed: No     No data to display            Family/Social Information:  Housing Arrangement: patient lives with her husband. She is alone during he day as husband is at work. Daughter and sister live in [place]  Family members/support persons in your life? Family- all  except husband live in [place] Transportation concerns: yes, if pt is too tired to drive during radiation  Employment: Legally disabled.  Income source: Social Security Disability and husband's work Surveyor, quantity concerns: No Type of concern: None Food access concerns: no Religious or spiritual practice: Yes-Jehovah's witness Advanced directives: No Services Currently in place:  [employer] Medicare, Medicaid  Coping/ Adjustment to diagnosis: Patient understands treatment plan and what happens next? Pt is very overwhelmed today. She seems to understand plan of surgery and radiation, but is having trouble processing everything today Concerns about diagnosis and/or treatment: Overwhelmed by information and support at home Patient reported stressors: Adjusting to my illness Current coping skills/ strengths: Supportive family/friends     SUMMARY: Current SDOH Barriers:  Limited social support and Transportation  Clinical Social Work Clinical Goal(s):  Explore community resource options for unmet needs related to:  Transportation and in home support  Interventions: Discussed common feeling and emotions when being diagnosed with cancer, and the importance of support during treatment Informed patient of the support team roles and support services at [facility] Provided CSW contact information and encouraged patient to call with any questions or concerns Provided patient with information about transportation through insurance and how to contact  Provided information about PCS through Medicaid and how to request through her medical provider for her other health concerns   Follow Up Plan: Patient will contact CSW with any support or resource needs and CSW will follow-up with patient by phone in 2 weeks Patient verbalizes understanding of plan: Yes    Osher Oettinger E Deavion Strider, LCSW Clinical Social Worker [facility]

## 2023-11-20 NOTE — Progress Notes (Signed)
 REFERRING PROVIDER: Loretha Ash, MD  PRIMARY PROVIDER:  Leontine Cramp, NP  PRIMARY REASON FOR VISIT:  1. Malignant neoplasm of upper-outer quadrant of left breast in female, estrogen receptor positive (HCC)   2. Family history of breast cancer   3. Family history of colon cancer    HISTORY OF PRESENT ILLNESS:   Allison Potts, a 63 y.o. female, was seen for a [place] cancer genetics consultation at the request of Dr. Loretha due to a personal and family history of cancer.  Allison Potts presents to clinic today to discuss the possibility of a hereditary predisposition to cancer, to discuss genetic testing, and to further clarify her future cancer risks, as well as potential cancer risks for family members.   In July 2025, at the age of 63, Allison Potts was diagnosed with invasive ductal carcinoma of the left breast (ER/PR positive, HER2 negative). She also has a personal history of thyroid  cancer diagnosed ~age 36.   CANCER HISTORY:  Oncology History  Malignant neoplasm of upper-outer quadrant of left breast in female, estrogen receptor positive (HCC)  10/28/2023 Mammogram   Suspicious 2.6 cm LEFT breast mass (2 o'clock 4 CMFN). Recommend ultrasound-guided core needle biopsy. Indeterminate LEFT axillary lymph node with cortical thickness of 4 mm. Recommend ultrasound-guided core needle biopsy.   11/12/2023 Pathology Results   Left breast needle core biopsy at 2 :00 4cmfn showed grade 2 IDC, LN tissue benign,   The tumor cells are NEGATIVE for Her2 (0-1+). Estrogen Receptor:  100%, POSITIVE, STRONG STAINING INTENSITY Progesterone Receptor:  100%, POSITIVE, STRONG STAINING INTENSITY Proliferation Marker Ki67:  5%    11/18/2023 Initial Diagnosis   Malignant neoplasm of upper-outer quadrant of left breast in female, estrogen receptor positive (HCC)   11/20/2023 Cancer Staging   Staging form: Breast, AJCC 8th Edition - Clinical stage from 11/20/2023: Stage IB (cT2, cN0, cM0, G2, ER+, PR+,  HER2-) - Signed by Loretha Ash, MD on 11/20/2023 Stage prefix: Initial diagnosis Histologic grading system: 3 grade system Laterality: Left Staged by: Pathologist and managing physician Stage used in treatment planning: Yes National guidelines used in treatment planning: Yes Type of national guideline used in treatment planning: NCCN    Past Medical History:  Diagnosis Date   Arthritis    Atrial fibrillation (HCC)    Fibromyalgia    Mental disorder    PID (acute pelvic inflammatory disease)    her 64s   Thyroid  disease    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    Past Surgical History:  Procedure Laterality Date   APPENDECTOMY     BREAST BIOPSY Left 11/12/2023   US  LT BREAST BX W LOC DEV 1ST LESION IMG BX SPEC US  GUIDE 11/12/2023 [facility] MAMMOGRAPHY   THYROID  LOBECTOMY     TUBAL LIGATION      Social History   Socioeconomic History   Marital status: Married    Spouse name: Not on file   Number of children: Not on file   Years of education: Not on file   Highest education level: Not on file  Occupational History   Not on file  Tobacco Use   Smoking status: Never   Smokeless tobacco: Never  Vaping Use   Vaping status: Never Used  Substance and Sexual Activity   Alcohol use: Not Currently   Drug use: Not Currently   Sexual activity: Yes    Birth control/protection: Surgical  Other Topics Concern   Not on file  Social History Narrative   Not on file  Social Drivers of Health   Financial Resource Strain: Low Risk  (05/25/2021)   Received from [facility] visits prior to 06/30/2022.   Overall Financial Resource Strain (CARDIA)    Difficulty of Paying Living Expenses: Not very hard  Food Insecurity: No Food Insecurity (11/20/2023)   Hunger Vital Sign    Worried About Running Out of Food in the Last Year: Never true    Ran Out of Food in the Last Year: Never true  Transportation Needs: No Transportation Needs (08/08/2023)   PRAPARE -  Administrator, Civil Service (Medical): No    Lack of Transportation (Non-Medical): No  Physical Activity: Insufficiently Active (05/25/2021)   Received from [facility] visits prior to 06/30/2022.   Exercise Vital Sign    On average, how many days per week do you engage in moderate to strenuous exercise (like a brisk walk)?: 5 days    On average, how many minutes do you engage in exercise at this level?: 20 min  Stress: Stress Concern Present (05/25/2021)   Received from [facility] visits prior to 06/30/2022.   [employer] of Occupational Health - Occupational Stress Questionnaire    Feeling of Stress : Very much  Social Connections: Unknown (11/30/2022)   Received from [employer]   Social Network    Social Network: Not on file     FAMILY HISTORY:  We obtained a detailed, 4-generation family history.  Significant diagnoses are listed below: Family History  Problem Relation Age of Onset   Diabetes Mother    Heart disease Mother    Emphysema Mother    Heart disease Father    Diabetes Father    Breast cancer Sister 86   Lung cancer Brother 34       smoked, maternal half-brother   Breast cancer Maternal Aunt        bilateral   Breast cancer Paternal Aunt    Breast cancer Niece 9 - 78   Colon cancer Maternal Cousin 82 - 65        Allison Potts is unaware of previous family history of genetic testing for hereditary cancer risks. Her daughter reports she was found to have Ashkenazi Jewish ancestry on her 23andme testing.   GENETIC COUNSELING ASSESSMENT: Allison Potts is a 63 y.o. female with a personal and family history of cancer which is somewhat suggestive of a hereditary predisposition to cancer. We, therefore, discussed and recommended the following at today's visit.   DISCUSSION: We discussed that 5 - 10% of cancer is hereditary, with most cases of breast cancer associated with BRCA1/2.  There are other genes that can  be associated with hereditary breast cancer syndromes.  We discussed that testing is beneficial for several reasons including knowing how to follow individuals after completing their treatment, identifying whether potential treatment options would be beneficial, and understanding if other family members could be at risk for cancer and allowing them to undergo genetic testing.   We reviewed the characteristics, features and inheritance patterns of hereditary cancer syndromes. We also discussed genetic testing, including the appropriate family members to test, the process of testing, insurance coverage and turn-around-time for results. We discussed the implications of a negative, positive, carrier and/or variant of uncertain significant result. We recommended Allison Potts pursue genetic testing for a panel that includes genes associated with cancer.   Allison Potts elected to have [facility] CancerNext-Expanded Panel. The CancerNext-Expanded gene panel offered by [facility] and includes sequencing, rearrangement, and RNA analysis for the following 77 genes: AIP, ALK, APC, ATM, AXIN2, BAP1, BARD1, BMPR1A, BRCA1, BRCA2, BRIP1, CDC73, CDH1, CDK4, CDKN1B, CDKN2A, CEBPA, CHEK2, CTNNA1, DDX41, DICER1, ETV6, FH, FLCN, GATA2, LZTR1, MAX, MBD4, MEN1, MET, MLH1, MSH2, MSH3, MSH6, MUTYH, NF1, NF2, NTHL1, PALB2, PHOX2B, PMS2, POT1, PRKAR1A, PTCH1, PTEN, RAD51C, RAD51D, RB1, RET, RPS20, RUNX1, SDHA, SDHAF2, SDHB, SDHC, SDHD, SMAD4, SMARCA4, SMARCB1, SMARCE1, STK11, SUFU, TMEM127, TP53, TSC1, TSC2, VHL, and WT1 (sequencing and deletion/duplication); EGFR, HOXB13, KIT, MITF, PDGFRA, POLD1, and POLE (sequencing only); EPCAM and GREM1 (deletion/duplication only).   Based on Allison Potts's personal and family history of cancer, she meets medical criteria for genetic testing. Despite that she meets criteria, she may still have an out of pocket cost. We discussed that if her out of pocket cost for testing is over $100, the laboratory  should contact them to discuss self-pay prices, patient pay assistance programs, if applicable, and other billing options.  PLAN: After considering the risks, benefits, and limitations, Allison Potts provided informed consent to pursue genetic testing and the blood sample was sent to [facility] for analysis of the CancerNext-Expanded Panel. Results should be available within approximately 2-3 weeks' time, at which point they will be disclosed by telephone to Allison Potts, as will any additional recommendations warranted by these results. Allison Potts will receive a summary of her genetic counseling visit and a copy of her results once available. This information will also be available in Epic.   Allison Potts questions were answered to her satisfaction today. Our contact information was provided should additional questions or concerns arise. Thank you for the referral and allowing us  to share in the care of your patient.   Audreana Hancox, MS, [facility] Genetic Counselor [email or web address] (P) [phone]  40 minutes were spent on the date of the encounter in service to the patient including preparation, face-to-face consultation, documentation and care coordination.  The patient brought her sister and daughter. Drs. Gudena and/or Lanny were available to discuss this case as needed.   _______________________________________________________________________ For Office Staff:  Number of people involved in session: 3 Was an Intern/ student involved with case: no

## 2023-11-20 NOTE — Research (Signed)
 [employer] 2021-05 - Specimen Collection Study to Evaluate Biomarkers in Subjects with Cancer    Patient Allison Potts was identified by Dr. Loretha as a potential candidate for the above listed study.  This Clinical Research Coordinator met with Allison Potts, [id number], on 11/20/23 in a manner and location that ensures patient privacy to discuss participation in the above listed research study.  Patient is Accompanied by daughter and sister.  Patient reads, speaks, and understands [place]. Patient declined blood specimen study for religious reasons. Dr. Loretha notified.     Allison Potts, MPH  Clinical Research Coordinator

## 2023-11-20 NOTE — Progress Notes (Signed)
 [facility] CONSULT NOTE  Patient Care Team: Leontine Cramp, NP as PCP - General (Nurse Practitioner) Kate Lonni CROME, MD as PCP - Cardiology (Cardiology) Tyree Nanetta SAILOR, RN as Oncology Nurse Navigator [place], Devere HERO, RN as Oncology Nurse Navigator Vernetta Berg, MD as Consulting Physician (General Surgery) Loretha Ash, MD as Consulting Physician (Hematology and Oncology) Shannon Agent, MD as Consulting Physician (Radiation Oncology)  CHIEF COMPLAINTS/PURPOSE OF CONSULTATION:  Newly diagnosed breast cancer  HISTORY OF PRESENTING ILLNESS:  Allison Potts 63 y.o. female is here because of recent diagnosis of left breast IDC  I reviewed her records extensively and collaborated the history with the patient.  SUMMARY OF ONCOLOGIC HISTORY: Oncology History  Malignant neoplasm of upper-outer quadrant of left breast in female, estrogen receptor positive (HCC)  10/28/2023 Mammogram   Suspicious 2.6 cm LEFT breast mass (2 o'clock 4 CMFN). Recommend ultrasound-guided core needle biopsy. Indeterminate LEFT axillary lymph node with cortical thickness of 4 mm. Recommend ultrasound-guided core needle biopsy.   11/12/2023 Pathology Results   Left breast needle core biopsy at 2 :00 4cmfn showed grade 2 IDC, LN tissue benign,   The tumor cells are NEGATIVE for Her2 (0-1+). Estrogen Receptor:  100%, POSITIVE, STRONG STAINING INTENSITY Progesterone Receptor:  100%, POSITIVE, STRONG STAINING INTENSITY Proliferation Marker Ki67:  5%    11/18/2023 Initial Diagnosis   Malignant neoplasm of upper-outer quadrant of left breast in female, estrogen receptor positive (HCC)    Discussed the use of AI scribe software for clinical note transcription with the patient, who gave verbal consent to proceed.  History of Present Illness Allison Potts is a 63 year old female with invasive ductal carcinoma of the left breast who presents for oncology consultation. She is accompanied by  her sister and daughter.  She has been diagnosed with invasive ductal carcinoma of the left breast, with a tumor measuring approximately 2.6 centimeters located at the two o'clock position. The cancer is estrogen and progesterone receptor positive and HER2 negative. The tumor is classified as grade 2. A biopsy of the lymph node was performed and was non-cancerous.  She did not notice the lump before the mammogram and biopsy. Currently, she experiences tenderness on the left side from the biopsy, which was performed over a week ago.  Her current medications include rosuvastatin , losartan , and Ozempic , which she recently started at a dose of 0.25 mg for diabetes management.  She is retired and engages in giving free Bible studies. She is concerned about the support she will receive post-surgery as she is currently away from her family, who live in [place] .    MEDICAL HISTORY:  Past Medical History:  Diagnosis Date   Arthritis    Atrial fibrillation (HCC)    Fibromyalgia    Mental disorder    PID (acute pelvic inflammatory disease)    her 56s   Thyroid  disease    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    SURGICAL HISTORY: Past Surgical History:  Procedure Laterality Date   APPENDECTOMY     BREAST BIOPSY Left 11/12/2023   US  LT BREAST BX W LOC DEV 1ST LESION IMG BX SPEC US  GUIDE 11/12/2023 [facility] MAMMOGRAPHY   THYROID  LOBECTOMY     TUBAL LIGATION      SOCIAL HISTORY: Social History   Socioeconomic History   Marital status: Married    Spouse name: Not on file   Number of children: Not on file   Years of education: Not on file   Highest education level: Not  on file  Occupational History   Not on file  Tobacco Use   Smoking status: Never   Smokeless tobacco: Never  Vaping Use   Vaping status: Never Used  Substance and Sexual Activity   Alcohol use: Not Currently   Drug use: Not Currently   Sexual activity: Yes    Birth control/protection: Surgical  Other Topics Concern    Not on file  Social History Narrative   Not on file   Social Drivers of Health   Financial Resource Strain: Low Risk  (05/25/2021)   [facility] from [facility] visits prior to 06/30/2022.   Overall Financial Resource Strain (CARDIA)    Difficulty of Paying Living Expenses: Not very hard  Food Insecurity: No Food Insecurity (08/08/2023)   Hunger Vital Sign    Worried About Running Out of Food in the Last Year: Never true    Ran Out of Food in the Last Year: Never true  Transportation Needs: No Transportation Needs (08/08/2023)   PRAPARE - Administrator, Civil Service (Medical): No    Lack of Transportation (Non-Medical): No  Physical Activity: Insufficiently Active (05/25/2021)   Received from [facility] visits prior to 06/30/2022.   Exercise Vital Sign    On average, how many days per week do you engage in moderate to strenuous exercise (like a brisk walk)?: 5 days    On average, how many minutes do you engage in exercise at this level?: 20 min  Stress: Stress Concern Present (05/25/2021)   Received from [facility] visits prior to 06/30/2022.   [employer] of Occupational Health - Occupational Stress Questionnaire    Feeling of Stress : Very much  Social Connections: Unknown (11/30/2022)   Received from [facility]   Social Network    Social Network: Not on file  Intimate Partner Violence: Unknown (11/30/2022)   Received from [facility]   HITS    Physically Hurt: Not on file    Insult or Talk Down To: Not on file    Threaten Physical Harm: Not on file    Scream or Curse: Not on file    FAMILY HISTORY: Family History  Problem Relation Age of Onset   Diabetes Mother    Heart disease Mother    Emphysema Mother    Heart disease Father    Diabetes Father     ALLERGIES:  is allergic to penicillins and antihistamines, diphenhydramine-type.  MEDICATIONS:  Current Outpatient Medications   Medication Sig Dispense Refill   buPROPion  (WELLBUTRIN  XL) 300 MG 24 hr tablet Take 300 mg by mouth daily.     clotrimazole -betamethasone  (LOTRISONE ) cream Apply 1 Application topically 2 (two) times daily. 30 g 0   fluticasone  (FLONASE ) 50 MCG/ACT nasal spray Place 2 sprays into both nostrils daily as needed for allergies.     gabapentin  (NEURONTIN ) 300 MG capsule Take 300 mg by mouth 2 (two) times daily.     levocetirizine (XYZAL) 5 MG tablet Take 5 mg by mouth at bedtime.     losartan  (COZAAR ) 100 MG tablet Take 100 mg by mouth daily.     metFORMIN (GLUCOPHAGE-XR) 750 MG 24 hr tablet Take 750 mg by mouth every morning.     methocarbamol  (ROBAXIN ) 500 MG tablet Take 1 tablet (500 mg total) by mouth every 8 (eight) hours as needed for muscle spasms. 15 tablet 0   metoprolol  tartrate (LOPRESSOR ) 25 MG tablet Take 1 tablet (25 mg total)  by mouth 2 (two) times daily. 60 tablet 0   Multiple Vitamins-Minerals (MULTIVITAMIN ADULT) CHEW Chew 1 each by mouth daily.     naproxen  (NAPROSYN ) 500 MG tablet Take 1 tablet (500 mg total) by mouth 2 (two) times daily as needed (pain). 30 tablet 0   nitrofurantoin , macrocrystal-monohydrate, (MACROBID ) 100 MG capsule Take 1 capsule (100 mg total) by mouth 2 (two) times daily. 10 capsule 0   OZEMPIC , 0.25 OR 0.5 MG/DOSE, 2 MG/3ML SOPN INJECT 0.25 MG INTO THE SKIN ONCE A WEEK 3 mL 0   QUEtiapine  (SEROQUEL ) 50 MG tablet Take 50 mg by mouth at bedtime.     rosuvastatin  (CRESTOR ) 10 MG tablet Take 2 tablets (20 mg total) by mouth daily. 90 tablet 1   No current facility-administered medications for this visit.    PHYSICAL EXAMINATION: ECOG PERFORMANCE STATUS: 0 - Asymptomatic  Vitals:   11/20/23 0856 11/20/23 0857  BP: (!) 152/79 (!) 150/67  Pulse: 66 64  Resp: 18   Temp: 98.1 F (36.7 C)   SpO2: 96%    Filed Weights   11/20/23 0856  Weight: 230 lb 12.8 oz (104.7 kg)    GENERAL:alert, no distress and comfortable Post biopsy changes noted.  Tenderness in left breast at the biopsy site, hence advised deep palpation. No regional adenopathy  LABORATORY DATA:  I have reviewed the data as listed Lab Results  Component Value Date   WBC 7.2 08/02/2021   HGB 13.6 11/23/2022   HCT 40.0 11/23/2022   MCV 88.9 08/02/2021   PLT 271 08/02/2021   Lab Results  Component Value Date   NA 140 07/16/2023   K 4.3 07/16/2023   CL 103 07/16/2023   CO2 28 07/16/2023    RADIOGRAPHIC STUDIES: I have personally reviewed the radiological reports and agreed with the findings in the report.  ASSESSMENT AND PLAN:  Malignant neoplasm of upper-outer quadrant of left breast in female, estrogen receptor positive (HCC) Assessment and Plan Assessment & Plan Invasive ductal carcinoma of left breast Invasive ductal carcinoma, 2.6 cm, grade 2, ER/PR positive, HER2 negative, non-cancerous lymph nodes, low growth rate, high cure likelihood. - Schedule lumpectomy with Dr. Vernetta. - Perform oncotype testing post-surgery. Discussed about oncotype - Plan radiation therapy post-surgery if oncotype confirms no benefit from chemotherapy. - Initiate hormone therapy post-radiation. Discussed about tamoxifen vs aromatase inhibitors in detail. - Engage nurse navigator for home health support if needed.  Type 2 diabetes mellitus Type 2 diabetes managed with low-dose Ozempic , hold due to potential gastroparesis before surgery. - Hold Ozempic  prior to surgery. - Consult with Dr. Vernetta regarding Ozempic  management.   Time spent: 45 min including history, physical exam, review of records, counseling and coordination of care. All questions were answered. The patient knows to call the clinic with any problems, questions or concerns.    Amber Stalls, MD 11/20/23

## 2023-11-20 NOTE — Research (Signed)
 [id number]- Effectiveness of Out-of Pocket Cost Communication and Financial Navigation (CostCOM) in Cancer Patients:     Patient Allison Potts was identified by Dr. Loretha as a potential candidate for the above listed study.  This Clinical Research Coordinator met with Allison Potts, [id number], on 11/20/23 in a manner and location that ensures patient privacy to discuss participation in the above listed research study.  Patient is Accompanied by daughter and sister.  A copy of the informed consent document and separate HIPAA Authorization was provided to the patient.  Patient reads, speaks, and understands [place].   Patient was provided with the business card of this Coordinator and encouraged to contact the research team with any questions.  Approximately 10 minutes were spent with the patient reviewing the informed consent documents.  Patient was provided the option of taking informed consent documents home to review and was encouraged to review at their convenience with their support network, including other care providers. Patient took the consent documents home to review.   Laury Quale, MPH  Clinical Research Coordinator

## 2023-11-20 NOTE — Therapy (Signed)
 OUTPATIENT PHYSICAL THERAPY BREAST CANCER BASELINE EVALUATION   Patient Name: Allison Potts MRN: [id number] DOB:[birth year], 63 y.o., female Today's Date: 11/20/2023  END OF SESSION:  PT End of Session - 11/20/23 1134     Visit Number 1    Number of Visits 2    Date for PT Re-Evaluation 01/15/24    PT Start Time 1042    PT Stop Time 1118    PT Time Calculation (min) 36 min    Activity Tolerance Patient tolerated treatment well    Behavior During Therapy WFL for tasks assessed/performed          Past Medical History:  Diagnosis Date   Arthritis    Atrial fibrillation (HCC)    Fibromyalgia    Mental disorder    PID (acute pelvic inflammatory disease)    her 20s   Thyroid  disease    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   Past Surgical History:  Procedure Laterality Date   APPENDECTOMY     BREAST BIOPSY Left 11/12/2023   US  LT BREAST BX W LOC DEV 1ST LESION IMG BX SPEC US  GUIDE 11/12/2023 [facility]   THYROID  LOBECTOMY     TUBAL LIGATION     Patient Active Problem List   Diagnosis Date Noted   Malignant neoplasm of upper-outer quadrant of left breast in female, estrogen receptor positive (HCC) 11/18/2023   Chest pain 08/01/2021   Hypokalemia 08/01/2021   Refusal of blood transfusions as patient is Jehovah's Witness 05/31/2021   Atrial fibrillation (HCC) 05/29/2021   Prediabetes 05/29/2021   Primary hypertension 03/21/2020   Anxiety and depression 12/19/2015   Fibromyalgia 02/07/2012   Obstructive sleep apnea (adult) (pediatric) 07/31/2011    REFERRING PROVIDER: Dr. [name]  REFERRING DIAG: Left breast cancer  THERAPY DIAG:  Malignant neoplasm of upper-outer quadrant of left breast in female, estrogen receptor positive (HCC)  Abnormal posture  Rationale for Evaluation and Treatment: Rehabilitation  ONSET DATE: 07/11/2023  SUBJECTIVE:                                                                                                                                                                                            SUBJECTIVE STATEMENT: Patient reports she is here today to be seen by her medical team for her newly diagnosed left breast cancer.   PERTINENT HISTORY:  Patient was diagnosed on 07/11/2023 with left grade 2 invasive ductal carcinoma breast cancer. It measures 2.6 cm and is located in the upper outer quadrant. It is ER/PR positive and HER2 negative with a Ki67 of 5%. She has a history of thyroid  cancer with a partial thyroidectomy.  PATIENT GOALS:   reduce lymphedema risk and learn post op HEP.   PAIN:  Are you having pain? No but has low back pain flare ups at times causing pain 10/10 in her low back.  PRECAUTIONS: Active CA   RED FLAGS: None   HAND DOMINANCE: right  WEIGHT BEARING RESTRICTIONS: No  FALLS:  Has patient fallen in last 6 months? No  LIVING ENVIRONMENT: Patient lives with: her husband Lives in: House/apartment Has following equipment at home: None  OCCUPATION: Bible study Runner, broadcasting/film/video and Jehovah Witness  LEISURE: She does a walking exercise for 20 minutes 2-3x/week  PRIOR LEVEL OF FUNCTION: Independent   OBJECTIVE: Note: Objective measures were completed at Evaluation unless otherwise noted.  COGNITION: Overall cognitive status: Within functional limits for tasks assessed    POSTURE:  Forward head and rounded shoulders posture  UPPER EXTREMITY AROM/PROM:  A/PROM RIGHT   eval   Shoulder extension 62  Shoulder flexion 161  Shoulder abduction 169  Shoulder internal rotation 50  Shoulder external rotation 90    (Blank rows = not tested)  A/PROM LEFT   eval  Shoulder extension 53  Shoulder flexion 128 but limited by breast pain from biopsy  Shoulder abduction 152 but limited by breast pain from biopsy  Shoulder internal rotation 74  Shoulder external rotation 84    (Blank rows = not tested)  CERVICAL AROM: All within normal limits  UPPER EXTREMITY STRENGTH:  WNL  LYMPHEDEMA ASSESSMENTS (in cm):   LANDMARK RIGHT   eval  10 cm proximal to olecranon process 36.8  Olecranon process 28.8  10 cm proximal to ulnar styloid process 27  Just proximal to ulnar styloid process 18.2  Across hand at thumb web space 19.8  At base of 2nd digit 7  (Blank rows = not tested)  LANDMARK LEFT   eval  10 cm proximal to olecranon process 37  Olecranon process 29.4  10 cm proximal to ulnar styloid process 25.9  Just proximal to ulnar styloid process 18.2  Across hand at thumb web space 20.2  At base of 2nd digit 6.6  (Blank rows = not tested)  L-DEX LYMPHEDEMA SCREENING:  The patient was assessed using the L-Dex machine today to produce a lymphedema index baseline score. The patient will be reassessed on a regular basis (typically every 3 months) to obtain new L-Dex scores. If the score is > 6.5 points away from his/her baseline score indicating onset of subclinical lymphedema, it will be recommended to wear a compression garment for 4 weeks, 12 hours per day and then be reassessed. If the score continues to be > 6.5 points from baseline at reassessment, we will initiate lymphedema treatment. Assessing in this manner has a 95% rate of preventing clinically significant lymphedema.   L-DEX FLOWSHEETS - 11/20/23 1100       L-DEX LYMPHEDEMA SCREENING   Measurement Type Unilateral    L-DEX MEASUREMENT EXTREMITY Upper Extremity    POSITION  Standing    DOMINANT SIDE Right    At Risk Side Left    BASELINE SCORE (UNILATERAL) 1.9          QUICK DASH SURVEY:  Allison Potts - 11/20/23 0001     Open a tight or new jar --   Pt left before completing questionnaire          PATIENT EDUCATION:  Education details: Time spent educating patient on aspects of self-care to maximize post op recovery. Patient was educated on where and how to get  a post op compression bra to use to reduce post op edema. Patient was also educated on the use of SOZO screenings and  surveillance principles for early identification of lymphedema onset. She was instructed to use the post op pillow in the axilla for pressure and pain relief. Patient educated on lymphedema risk reduction and post op shoulder/posture HEP. Person educated: Patient Education method: Explanation, Demonstration, Handout Education comprehension: Patient verbalized understanding and returned demonstration  HOME EXERCISE PROGRAM: Patient was instructed today in a home exercise program today for post op shoulder range of motion. These included active assist shoulder flexion in sitting, scapular retraction, wall walking with shoulder abduction, and hands behind head external rotation.  She was encouraged to do these twice a day, holding 3 seconds and repeating 5 times when permitted by her physician.   ASSESSMENT:  CLINICAL IMPRESSION: Patient was diagnosed on 07/11/2023 with left grade 2 invasive ductal carcinoma breast cancer. It measures 2.6 cm and is located in the upper outer quadrant. It is ER/PR positive and HER2 negative with a Ki67 of 5%. She has a history of thyroid  cancer with a partial thyroidectomy.Her multidisciplinary medical team met prior to her assessments to determine a recommended treatment plan. She is planning to have a left lumpectomy and sentinel node biopsy followed by Oncotype testing, radiation, and anti-estrogen therapy. She will benefit from a post op PT reassessment to determine needs and from L-Dex screens every 3 months for 2 years to detect subclinical lymphedema.  Pt will benefit from skilled therapeutic intervention to improve on the following deficits: Decreased knowledge of precautions, impaired UE functional use, pain, decreased ROM, postural dysfunction.   PT treatment/interventions: ADL/self-care home management, pt/family education, therapeutic exercise  REHAB POTENTIAL: Excellent  CLINICAL DECISION MAKING: Stable/uncomplicated  EVALUATION COMPLEXITY:  Low   GOALS: Goals reviewed with patient? YES  LONG TERM GOALS: (STG=LTG)    Name Target Date Goal status  1 Pt will be able to verbalize understanding of pertinent lymphedema risk reduction practices relevant to her dx specifically related to skin care.  Baseline:  No knowledge 11/20/2023 Achieved at eval  2 Pt will be able to return demo and/or verbalize understanding of the post op HEP related to regaining shoulder ROM. Baseline:  No knowledge 11/20/2023 Achieved at eval  3 Pt will be able to verbalize understanding of the importance of viewing the post op After Breast CA Class video for further lymphedema risk reduction education and therapeutic exercise.  Baseline:  No knowledge 11/20/2023 Achieved at eval  4 Pt will demo she has regained full shoulder ROM and function post operatively compared to baselines.  Baseline: See objective measurements taken today. 01/15/2024     PLAN:  PT FREQUENCY/DURATION: EVAL and 1 follow up appointment.   PLAN FOR NEXT SESSION: will reassess 3-4 weeks post op to determine needs.   Patient will follow up at outpatient cancer rehab 3-4 weeks following surgery.  If the patient requires physical therapy at that time, a specific plan will be dictated and sent to the referring physician for approval. The patient was educated today on appropriate basic range of motion exercises to begin post operatively and the importance of viewing the After Breast Cancer class video following surgery.  Patient was educated today on lymphedema risk reduction practices as it pertains to recommendations that will benefit the patient immediately following surgery.  She verbalized good understanding.    Physical Therapy Information for After Breast Cancer Surgery/Treatment:  Lymphedema is a swelling condition that you  may be at risk for in your arm if you have lymph nodes removed from the armpit area.  After a sentinel node biopsy, the risk is approximately 5-9% and is higher  after an axillary node dissection.  There is treatment available for this condition and it is not life-threatening.  Contact your physician or physical therapist with concerns. You may begin the 4 shoulder/posture exercises (see additional sheet) when permitted by your physician (typically a week after surgery).  If you have drains, you may need to wait until those are removed before beginning range of motion exercises.  A general recommendation is to not lift your arms above shoulder height until drains are removed.  These exercises should be done to your tolerance and gently.  This is not a no pain/no gain type of recovery so listen to your body and stretch into the range of motion that you can tolerate, stopping if you have pain.  If you are having immediate reconstruction, ask your plastic surgeon about doing exercises as he or she may want you to wait. We encourage you to view the After Breast Cancer class video following surgery.  You will learn information related to lymphedema risk, prevention and treatment and additional exercises to regain mobility following surgery.   While undergoing any medical procedure or treatment, try to avoid blood pressure being taken or needle sticks from occurring on the arm on the side of cancer.   This recommendation begins after surgery and continues for the rest of your life.  This may help reduce your risk of getting lymphedema (swelling in your arm). An excellent resource for those seeking information on lymphedema is the National Lymphedema Network's web site. It can be accessed at www.lymphnet.org If you notice swelling in your hand, arm or breast at any time following surgery (even if it is many years from now), please contact your doctor or physical therapist to discuss this.  Lymphedema can be treated at any time but it is easier for you if it is treated early on.  If you feel like your shoulder motion is not returning to normal in a reasonable amount of time,  please contact your surgeon or physical therapist.  [facility] [phone]. [address], [address], [place] [address]  [facility] CLASS After Breast Cancer Class  After Breast Cancer Class is a specially designed exercise class video to assist you in a safe recover after having breast cancer surgery.  In this video you will learn how to get back to full function whether your drains were just removed or if you had surgery a month ago. The video can be viewed on this page: [email or web address].boyd-meyer.org/ or on YouTube here: https://youtu.az/[email or web address].  Class Goals  Understand specific stretches to improve the flexibility of you chest and shoulder. Learn ways to safely strengthen your upper body and improve your posture. Understand the warning signs of infection and why you may be at risk for an arm infection. Learn about Lymphedema and prevention.  ** You do not need to view this video until after surgery.  Drains should be removed to participate in the recommended exercises on the video.  Patient was instructed today in a home exercise program today for post op shoulder range of motion. These included active assist shoulder flexion in sitting, scapular retraction, wall walking with shoulder abduction, and hands behind head external rotation.  She was encouraged to do these twice a day, holding 3 seconds and repeating 5 times when permitted  by her physician.  Eward Wonda Sharps, [place] 11/20/23 11:45 AM

## 2023-11-22 ENCOUNTER — Other Ambulatory Visit: Payer: Self-pay | Admitting: Surgery

## 2023-11-22 DIAGNOSIS — Z853 Personal history of malignant neoplasm of breast: Secondary | ICD-10-CM

## 2023-11-25 ENCOUNTER — Telehealth: Payer: Self-pay

## 2023-11-25 DIAGNOSIS — C50412 Malignant neoplasm of upper-outer quadrant of left female breast: Secondary | ICD-10-CM

## 2023-11-25 NOTE — Research (Signed)
 [id number]- Effectiveness of Out-of Pocket Cost Communication and Financial Navigation (CostCOM) in Cancer Patients:    Called pt to checkin via mobile phone number listed same as earlier today. Patient was available to speak. We discussed she is busy this week has not had a chance to review documents and cannot discuss interest in study until she's had an opportunity to read over the consent documents. She requested to give her the week and check back in ..maybe mid to later next week. Patient has been provided with contact information if she has any questions.  Laury Quale, MPH  Clinical Research Coordinator

## 2023-11-25 NOTE — Telephone Encounter (Signed)
 [id number]- Effectiveness of Out-of Pocket Cost Communication and Financial Navigation (CostCOM) in Cancer Patients:    Called pt to fu about interest in CostCom study. Pt says she's pressed for time. Will plan to call pt another time.

## 2023-11-27 ENCOUNTER — Encounter: Payer: Self-pay | Admitting: *Deleted

## 2023-11-27 ENCOUNTER — Telehealth: Payer: Self-pay | Admitting: *Deleted

## 2023-11-27 NOTE — Telephone Encounter (Signed)
Spoke with patient to follow up from [facility] and assess navigation needs. Patient denies any questions or concerns at this time. Encouraged her to call should anything arise.Patient verbalized understanding.  

## 2023-11-28 ENCOUNTER — Telehealth: Payer: Self-pay | Admitting: Hematology and Oncology

## 2023-11-28 NOTE — Telephone Encounter (Signed)
 Spoke with patient confirming upcoming appointment

## 2023-12-02 ENCOUNTER — Encounter: Payer: Self-pay | Admitting: Genetic Counselor

## 2023-12-02 ENCOUNTER — Telehealth: Payer: Self-pay | Admitting: Genetic Counselor

## 2023-12-02 DIAGNOSIS — Z1379 Encounter for other screening for genetic and chromosomal anomalies: Secondary | ICD-10-CM | POA: Insufficient documentation

## 2023-12-02 NOTE — Telephone Encounter (Signed)
 I contacted Ms. Saffran to discuss her genetic testing results. No pathogenic variants were identified in the 77 genes analyzed. Detailed clinic note to follow.  The test report has been scanned into EPIC and is located under the Molecular Pathology section of the Results Review tab.  A portion of the result report is included below for reference.   Olivia Pavelko, MS, [facility] Genetic Counselor [email or web address] (P) [phone]

## 2023-12-03 ENCOUNTER — Telehealth: Payer: Self-pay | Admitting: *Deleted

## 2023-12-03 NOTE — Telephone Encounter (Signed)
 This RN spoke with pt per her call stating concern due to continued discomfort where they put the clips in my breast  Like it is pinching  She states her sister who has been diagnosed with DCIS and  the clips placed said her's do not hurt at all  This RN discussed above - including people responding differently to same type of procedures.  Discussed clips are usually removed with surgery.  Informed her to use cool compresses as well as to let Dr Vernetta know of discomfort per pending surgery.  Pt appreciated discussion of above.  This note will be forwarded to her team for review of communication.  No further needs at this time.

## 2023-12-05 ENCOUNTER — Inpatient Hospital Stay: Attending: Hematology and Oncology | Admitting: Licensed Clinical Social Worker

## 2023-12-05 NOTE — Progress Notes (Signed)
 [facility] CSW Progress Note  Clinical Child psychotherapist contacted patient by phone to follow-up on emotional support.    Interventions: Provided brief mental health counseling with regard to coping with health concerns    Pt in significant pain today with legs (related to other health issues).  She plans to read her Bible which helps her cope and will call her primary doctor today.  CSW encouraged pt to ask about personal care services to assist with ADLs, such as cleaning, which pt feels unable to do at this time but is important to her.     Follow Up Plan:  Patient will contact CSW with any support or resource needs    Leigha Olberding E Waynette Towers, LCSW Clinical Social Worker [facility]

## 2023-12-06 ENCOUNTER — Encounter (HOSPITAL_BASED_OUTPATIENT_CLINIC_OR_DEPARTMENT_OTHER): Payer: Self-pay | Admitting: Surgery

## 2023-12-06 ENCOUNTER — Other Ambulatory Visit: Payer: Self-pay

## 2023-12-06 ENCOUNTER — Encounter (HOSPITAL_BASED_OUTPATIENT_CLINIC_OR_DEPARTMENT_OTHER)
Admission: RE | Admit: 2023-12-06 | Discharge: 2023-12-06 | Disposition: A | Discharge status: Home / Self Care | Source: Ambulatory Visit | Attending: Surgery | Admitting: Surgery

## 2023-12-06 ENCOUNTER — Ambulatory Visit (INDEPENDENT_AMBULATORY_CARE_PROVIDER_SITE_OTHER): Admitting: Podiatry

## 2023-12-06 DIAGNOSIS — I493 Ventricular premature depolarization: Secondary | ICD-10-CM | POA: Diagnosis not present

## 2023-12-06 DIAGNOSIS — M7752 Other enthesopathy of left foot: Secondary | ICD-10-CM | POA: Diagnosis not present

## 2023-12-06 DIAGNOSIS — M7751 Other enthesopathy of right foot: Secondary | ICD-10-CM

## 2023-12-06 DIAGNOSIS — Z01810 Encounter for preprocedural cardiovascular examination: Secondary | ICD-10-CM | POA: Diagnosis present

## 2023-12-06 DIAGNOSIS — I10 Essential (primary) hypertension: Secondary | ICD-10-CM | POA: Diagnosis not present

## 2023-12-06 MED ORDER — ENSURE PRE-SURGERY PO LIQD: 296.0000 mL | Freq: Once | ORAL | Status: DC

## 2023-12-06 MED ORDER — DICLOFENAC SODIUM 75 MG PO TBEC: 75.0000 mg | DELAYED_RELEASE_TABLET | Freq: Two times a day (BID) | ORAL | 0 refills | Status: DC

## 2023-12-06 MED ORDER — CHLORHEXIDINE GLUCONATE CLOTH 2 % EX PADS: 6.0000 | MEDICATED_PAD | Freq: Once | CUTANEOUS | Status: DC

## 2023-12-06 NOTE — Progress Notes (Signed)

## 2023-12-06 NOTE — Progress Notes (Signed)
 Subjective:  Patient ID: Allison Potts, female    DOB: 07-02-1960,  MRN: [id number]  Chief Complaint  Patient presents with   Foot Pain    63 y.o. female presents with the above complaint.  Patient presents with complaint of bilateral ankle swelling.  She states that it causes her some discomfort.  Wanted to get it evaluated.  Left side is worse than right side.  Wanted to discuss treatment options for this she is a diabetic.  She denies seeing anyone else prior to seeing me for this.  Denies any other acute complaints just 2 out of 10.   Review of Systems: Negative except as noted in the HPI. Denies N/V/F/Ch.  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    Current Outpatient Medications:    clotrimazole -betamethasone  (LOTRISONE ) cream, Apply 1 Application topically 2 (two) times daily., Disp: 30 g, Rfl: 0   fluticasone  (FLONASE ) 50 MCG/ACT nasal spray, Place 2 sprays into both nostrils daily as needed for allergies., Disp: , Rfl:    levocetirizine (XYZAL) 5 MG tablet, Take 5 mg by mouth at bedtime., Disp: , Rfl:    losartan  (COZAAR ) 100 MG tablet, Take 100 mg by mouth daily., Disp: , Rfl:    Omega-3 Fatty Acids (FISH OIL) 1000 MG CPDR, Take by mouth., Disp: , Rfl:    oxyCODONE  (OXY IR/ROXICODONE ) 5 MG immediate release tablet, Take 1 tablet (5 mg total) by mouth every 6 (six) hours as needed for moderate pain (pain score 4-6) or severe pain (pain score 7-10)., Disp: 25 tablet, Rfl: 0   OZEMPIC , 0.25 OR 0.5 MG/DOSE, 2 MG/3ML SOPN, INJECT 0.25 MG INTO THE SKIN ONCE A WEEK, Disp: 3 mL, Rfl: 0   QUEtiapine  (SEROQUEL ) 50 MG tablet, Take 200 mg by  mouth at bedtime., Disp: , Rfl:    rosuvastatin  (CRESTOR ) 10 MG tablet, Take 2 tablets (20 mg total) by mouth daily., Disp: 90 tablet, Rfl: 1   VITAMIN D, ERGOCALCIFEROL, PO, Take by mouth., Disp: , Rfl:   Social History   Tobacco Use  Smoking Status Never  Smokeless Tobacco Never    Allergies  Allergen Reactions   Penicillins Hives, Anaphylaxis and Rash    Passing out, lumps and bumps, was given epi Other reaction(s): Hives/Urticaria Trouble Breathing  Other reaction(s): Hives/Urticaria Trouble Breathing  Trouble Breathing     Antihistamines, Diphenhydramine-Type     Pt does not want to take generic benedryl due to possible side effects   Objective:  There were no vitals filed for this visit. There is no height or weight on file to calculate BMI. Constitutional Well developed. Well nourished.  Vascular Dorsalis pedis pulses palpable bilaterally. Posterior tibial pulses palpable bilaterally. Capillary refill normal to all digits.  No cyanosis or clubbing noted. Pedal hair growth normal.  Neurologic Normal speech. Oriented to person, place, and time. Epicritic sensation to light touch grossly present bilaterally.  Dermatologic Nails well groomed and normal in appearance. No open wounds. No skin lesions.  Orthopedic: Pain on palpation bilateral lateral ankle.  Mild pain with range of motion pain at the ATFL ligament bilaterally.  No pain at the Achilles tendon peroneal tendon posterior tibial tendon   Radiographs: None Assessment:   1. Capsulitis of ankle, right   2. Capsulitis of ankle, left    Plan:  Patient was evaluated and treated and all questions answered.  Bilateral ankle capsulitis - All questions and concerns were discussed with the patient in extensive detail given the amount of pain that she is experiencing she would benefit from steroid injection to help with the inflammation of the pain that she is experiencing.  However patient would like to hold off  on steroid injection for now.  If there is no improvement we will discuss steroid injection in the future - I discussed Voltaren  tablet which was sent to the pharmacy to help with some arthritic pain that she might be experiencing  No follow-ups on file.   Bilateral ankle capsulitis due to swelling does not want injection Voltaren  tablet

## 2023-12-10 ENCOUNTER — Ambulatory Visit
Admission: RE | Admit: 2023-12-10 | Discharge: 2023-12-10 | Disposition: A | Discharge status: Home / Self Care | Source: Ambulatory Visit | Attending: Surgery | Admitting: Surgery

## 2023-12-10 ENCOUNTER — Other Ambulatory Visit: Payer: Self-pay | Admitting: Surgery

## 2023-12-10 DIAGNOSIS — Z853 Personal history of malignant neoplasm of breast: Secondary | ICD-10-CM

## 2023-12-10 HISTORY — PX: BREAST BIOPSY: SHX20

## 2023-12-11 NOTE — H&P (Signed)
 REFERRING PHYSICIAN: Loretha Linnette Lane Mamie* PROVIDER: VICENTA DASIE POLI, MD MRN: [id number] DOB: 1960/05/24 DATE OF ENCOUNTER: 11/20/2023 Subjective   Chief Complaint: Breast Cancer  History of Present Illness: Allison Potts is a 63 y.o. female who is seen today as an office consultation for evaluation of Breast Cancer  This is a 63 year old female who was found on screen mammography of the left breast to have an abnormality. She underwent an ultrasound as well showing a 2.6 cm mass in the upper outer quadrant of the left breast. There were 2 slightly enlarged lymph nodes as well which were biopsied. 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 It was 100% ER positive, 100% PR positive, HER2 negative, and had a Ki-67 of 5%. Both biopsy lymph nodes were negative. She has a sister recently diagnosed with DCIS and other people in the family with breast cancer. She is otherwise without complaints. She has no cardiopulmonary issues. She has had no previous problems with general anesthesia. She denies nipple discharge.  Review of Systems: A complete review of systems was obtained from the patient. I have reviewed this information and discussed as appropriate with the patient. See HPI as well for other ROS.  ROS   Medical History: Past Medical History:  Diagnosis Date  Anxiety  Arthritis  Diabetes mellitus without complication (CMS/HHS-HCC)  History of cancer  Hyperlipidemia  Hypertension  Sleep apnea   Patient Active Problem List  Diagnosis  Malignant neoplasm of upper-outer quadrant of left breast in female, estrogen receptor positive (CMS/HHS-HCC)  Primary hypertension   Past Surgical History:  Procedure Laterality Date  .Left Breast Biopsy Left 11/12/2023  .Thyroid  Lobectomy  .Tubal Ligation  APPENDECTOMY    Allergies  Allergen Reactions  Lamotrigine Hives and Rash  Penicillins Anaphylaxis, Hives and Rash  Passing  out, lumps and bumps, was given epi Other reaction(s): Hives/Urticaria Trouble Breathing  Other reaction(s): Hives/Urticaria Trouble Breathing  Trouble Breathing   Trouble Breathing   Other reaction(s): Hives/Urticaria Trouble Breathing  Trouble Breathing  Trouble Breathing  Passing out, lumps and bumps, was given epi Other reaction(s): Hives/Urticaria Trouble Breathing  Other reaction(s): Hives/Urticaria Trouble Breathing  Trouble Breathing   Other reaction(s): Hives/Urticaria Trouble Breathing   Trouble Breathing   Current Outpatient Medications on File Prior to Visit  Medication Sig Dispense Refill  losartan  (COZAAR ) 100 MG tablet Take 100 mg by mouth once daily  OZEMPIC  0.25 mg or 0.5 mg (2 mg/3 mL) pen injector Inject 0.25 mg subcutaneously every 7 (seven) days  rosuvastatin  (CRESTOR ) 10 MG tablet Take 20 mg by mouth once daily  QUEtiapine  (SEROQUEL ) 100 MG tablet Take 100 mg by mouth at bedtime   No current facility-administered medications on file prior to visit.   Family History  Problem Relation Age of Onset  Obesity Mother  High blood pressure (Hypertension) Mother  Hyperlipidemia (Elevated cholesterol) Mother  Coronary Artery Disease (Blocked arteries around heart) Mother  Diabetes Mother  Deep vein thrombosis (DVT or abnormal blood clot formation) Mother  Stroke Father  Skin cancer Father  Obesity Father  High blood pressure (Hypertension) Father  Hyperlipidemia (Elevated cholesterol) Father  Coronary Artery Disease (Blocked arteries around heart) Father  Diabetes Father  Deep vein thrombosis (DVT or abnormal blood clot formation) Father  Stroke Sister  Skin cancer Sister  Obesity Sister  High blood pressure (Hypertension) Sister  Breast cancer Sister    Social History   Tobacco Use  Smoking Status Former  Types: Cigarettes  Smokeless Tobacco Never  Tobacco Comments  Stop smoking 40 years ago    Social History   Socioeconomic History   Marital status: Married  Tobacco Use  Smoking status: Former  Types: Cigarettes  Smokeless tobacco: Never  Tobacco comments:  Stop smoking 40 years ago  Vaping Use  Vaping status: Never Used   Social Drivers of Corporate investment banker Strain: Low Risk (05/25/2021)  Received from [facility] visits prior to 06/30/2022.  Overall Financial Resource Strain (CARDIA)  Difficulty of Paying Living Expenses: Not very hard  Food Insecurity: No Food Insecurity (08/08/2023)  Received from [facility]  Hunger Vital Sign  Within the past 12 months, you worried that your food would run out before you got the money to buy more.: Never true  Within the past 12 months, the food you bought just didn't last and you didn't have money to get more.: Never true  Transportation Needs: No Transportation Needs (08/08/2023)  Received from [facility] - Transportation  Lack of Transportation (Medical): No  Lack of Transportation (Non-Medical): No  Physical Activity: Insufficiently Active (05/25/2021)  Received from [facility] visits prior to 06/30/2022.  Exercise Vital Sign  On average, how many days per week do you engage in moderate to strenuous exercise (like a brisk walk)?: 5 days  On average, how many minutes do you engage in exercise at this level?: 20 min  Stress: Stress Concern Present (05/25/2021)  Received from [facility] visits prior to 06/30/2022.  [employer] of Occupational Health - Occupational Stress Questionnaire  Feeling of Stress : Very much  Received from [employer]  Social Network  Housing Stability: Low Risk (05/25/2021)  Received from [facility] visits prior to 06/30/2022.  Housing Stability Vital Sign  Unable to Pay for Housing in the Last Year: No  Number of Places Lived in the Last Year: 1  In the last 12 months, was there a time when you did not have a steady place to sleep or  slept in a shelter (including now)?: No   Objective:  There were no vitals filed for this visit.  There is no height or weight on file to calculate BMI.  Physical Exam   A chaperone was present for the examination  She appears well on exam.  There is ecchymosis in the left breast but no palpable left breast mass. There is no right breast mass. The nipple areolar complexes are normal. I cannot palpate any axillary adenopathy on either side.  Labs, Imaging and Diagnostic Testing:  Assessment and Plan:   Diagnoses and all orders for this visit:  Invasive ductal carcinoma of breast, left (CMS/HHS-HCC)   I have discussed the diagnosis with the patient and her family. We have also discussed her in our multidisciplinary breast cancer clinic this morning. From a surgical standpoint I discussed surgery with her regarding breast cancer. We discussed breast conservation with a lumpectomy followed by radiation versus mastectomy and the long-term results of each. Tentatively, she is leaning toward proceeding with a left breast lumpectomy and sentinel lymph node biopsy. I explained proceeding with a radioactive seed guided lumpectomy and sentinel node biopsy with mag trace. I discussed the risk of the procedures which include but are not limited to bleeding, infection, the need for further surgery if margins are positive or lymph nodes are positive, seroma formation, arm swelling, injury to surrounding structures, the need  for further procedures, cardiopulmonary issues with anesthesia, postoperative recovery, etc. She will have no one to stay with the night of surgery so we will also keep her overnight at least. They understand and tentatively agreed to proceed with surgery as outlined above for now.

## 2023-12-12 ENCOUNTER — Encounter (HOSPITAL_BASED_OUTPATIENT_CLINIC_OR_DEPARTMENT_OTHER)
Admission: RE | Disposition: A | Discharge status: Home / Self Care | Payer: Self-pay | Source: Home / Self Care | Attending: Surgery

## 2023-12-12 ENCOUNTER — Ambulatory Visit
Admission: RE | Admit: 2023-12-12 | Discharge: 2023-12-12 | Disposition: A | Discharge status: Home / Self Care | Source: Ambulatory Visit | Attending: Surgery | Admitting: Surgery

## 2023-12-12 ENCOUNTER — Ambulatory Visit (HOSPITAL_BASED_OUTPATIENT_CLINIC_OR_DEPARTMENT_OTHER): Admitting: Anesthesiology

## 2023-12-12 ENCOUNTER — Ambulatory Visit (HOSPITAL_BASED_OUTPATIENT_CLINIC_OR_DEPARTMENT_OTHER)
Admission: RE | Admit: 2023-12-12 | Discharge: 2023-12-12 | Disposition: A | Discharge status: Home / Self Care | Attending: Surgery | Admitting: Surgery

## 2023-12-12 ENCOUNTER — Other Ambulatory Visit: Payer: Self-pay

## 2023-12-12 ENCOUNTER — Encounter (HOSPITAL_BASED_OUTPATIENT_CLINIC_OR_DEPARTMENT_OTHER): Payer: Self-pay | Admitting: Surgery

## 2023-12-12 DIAGNOSIS — C50912 Malignant neoplasm of unspecified site of left female breast: Secondary | ICD-10-CM | POA: Diagnosis not present

## 2023-12-12 DIAGNOSIS — F419 Anxiety disorder, unspecified: Secondary | ICD-10-CM | POA: Insufficient documentation

## 2023-12-12 DIAGNOSIS — Z7985 Long-term (current) use of injectable non-insulin antidiabetic drugs: Secondary | ICD-10-CM | POA: Insufficient documentation

## 2023-12-12 DIAGNOSIS — E119 Type 2 diabetes mellitus without complications: Secondary | ICD-10-CM | POA: Diagnosis not present

## 2023-12-12 DIAGNOSIS — C50412 Malignant neoplasm of upper-outer quadrant of left female breast: Secondary | ICD-10-CM | POA: Diagnosis present

## 2023-12-12 DIAGNOSIS — Z170 Estrogen receptor positive status [ER+]: Secondary | ICD-10-CM | POA: Insufficient documentation

## 2023-12-12 DIAGNOSIS — J45909 Unspecified asthma, uncomplicated: Secondary | ICD-10-CM | POA: Diagnosis not present

## 2023-12-12 DIAGNOSIS — Z7984 Long term (current) use of oral hypoglycemic drugs: Secondary | ICD-10-CM | POA: Diagnosis not present

## 2023-12-12 DIAGNOSIS — Z79899 Other long term (current) drug therapy: Secondary | ICD-10-CM | POA: Insufficient documentation

## 2023-12-12 DIAGNOSIS — Z803 Family history of malignant neoplasm of breast: Secondary | ICD-10-CM | POA: Insufficient documentation

## 2023-12-12 DIAGNOSIS — Z17411 Hormone receptor positive with human epidermal growth factor receptor 2 negative status: Secondary | ICD-10-CM | POA: Diagnosis not present

## 2023-12-12 DIAGNOSIS — Z853 Personal history of malignant neoplasm of breast: Secondary | ICD-10-CM

## 2023-12-12 DIAGNOSIS — G4730 Sleep apnea, unspecified: Secondary | ICD-10-CM | POA: Insufficient documentation

## 2023-12-12 DIAGNOSIS — I10 Essential (primary) hypertension: Secondary | ICD-10-CM | POA: Insufficient documentation

## 2023-12-12 DIAGNOSIS — F32A Depression, unspecified: Secondary | ICD-10-CM | POA: Insufficient documentation

## 2023-12-12 HISTORY — DX: Depression, unspecified: F32.A

## 2023-12-12 HISTORY — DX: Essential (primary) hypertension: I10

## 2023-12-12 HISTORY — DX: Type 2 diabetes mellitus without complications: E11.9

## 2023-12-12 HISTORY — DX: Anxiety disorder, unspecified: F41.9

## 2023-12-12 HISTORY — DX: Sleep apnea, unspecified: G47.30

## 2023-12-12 HISTORY — PX: BREAST LUMPECTOMY WITH RADIOACTIVE SEED AND SENTINEL LYMPH NODE BIOPSY: SHX6550

## 2023-12-12 HISTORY — DX: Unspecified asthma, uncomplicated: J45.909

## 2023-12-12 LAB — GLUCOSE, CAPILLARY
Glucose-Capillary: 134 mg/dL — ABNORMAL HIGH (ref 70–99)
Glucose-Capillary: 154 mg/dL — ABNORMAL HIGH (ref 70–99)

## 2023-12-12 SURGERY — BREAST LUMPECTOMY WITH RADIOACTIVE SEED AND SENTINEL LYMPH NODE BIOPSY
Anesthesia: General | Site: Breast | Laterality: Left

## 2023-12-12 MED ORDER — CIPROFLOXACIN IN D5W 400 MG/200ML IV SOLN
400.0000 mg | INTRAVENOUS | Status: AC
  Administered 2023-12-12: 400 mg via INTRAVENOUS

## 2023-12-12 MED ORDER — PHENYLEPHRINE HCL (PRESSORS) 10 MG/ML IV SOLN
INTRAVENOUS | Status: DC | PRN
  Administered 2023-12-12 (×2): 80 ug via INTRAVENOUS

## 2023-12-12 MED ORDER — DROPERIDOL 2.5 MG/ML IJ SOLN: 0.6250 mg | Freq: Once | INTRAMUSCULAR | Status: DC | PRN

## 2023-12-12 MED ORDER — LACTATED RINGERS IV SOLN: INTRAVENOUS | Status: DC

## 2023-12-12 MED ORDER — MIDAZOLAM HCL 2 MG/2ML IJ SOLN
2.0000 mg | Freq: Once | INTRAMUSCULAR | Status: AC
  Administered 2023-12-12: 2 mg via INTRAVENOUS

## 2023-12-12 MED ORDER — SCOPOLAMINE 1 MG/3DAYS TD PT72
1.0000 | MEDICATED_PATCH | TRANSDERMAL | Status: DC
  Administered 2023-12-12: 1.5 mg via TRANSDERMAL

## 2023-12-12 MED ORDER — ACETAMINOPHEN 500 MG PO TABS
ORAL_TABLET | ORAL | Status: AC
  Filled 2023-12-12: qty 2

## 2023-12-12 MED ORDER — FENTANYL CITRATE (PF) 100 MCG/2ML IJ SOLN
INTRAMUSCULAR | Status: AC
  Filled 2023-12-12: qty 2

## 2023-12-12 MED ORDER — PROPOFOL 10 MG/ML IV BOLUS
INTRAVENOUS | Status: DC | PRN
  Administered 2023-12-12: 50 mg via INTRAVENOUS
  Administered 2023-12-12: 150 mg via INTRAVENOUS

## 2023-12-12 MED ORDER — ACETAMINOPHEN 500 MG PO TABS
1000.0000 mg | ORAL_TABLET | ORAL | Status: AC
  Administered 2023-12-12: 1000 mg via ORAL

## 2023-12-12 MED ORDER — ONDANSETRON HCL 4 MG/2ML IJ SOLN
INTRAMUSCULAR | Status: DC | PRN
  Administered 2023-12-12: 4 mg via INTRAVENOUS

## 2023-12-12 MED ORDER — BUPIVACAINE-EPINEPHRINE 0.5% -1:200000 IJ SOLN
INTRAMUSCULAR | Status: DC | PRN
  Administered 2023-12-12: 20 mL

## 2023-12-12 MED ORDER — MIDAZOLAM HCL 2 MG/2ML IJ SOLN
INTRAMUSCULAR | Status: AC
  Filled 2023-12-12: qty 2

## 2023-12-12 MED ORDER — DEXAMETHASONE SODIUM PHOSPHATE 10 MG/ML IJ SOLN
INTRAMUSCULAR | Status: AC
  Filled 2023-12-12: qty 1

## 2023-12-12 MED ORDER — DEXAMETHASONE SODIUM PHOSPHATE 4 MG/ML IJ SOLN
INTRAMUSCULAR | Status: DC | PRN
  Administered 2023-12-12: 4 mg via INTRAVENOUS

## 2023-12-12 MED ORDER — OXYCODONE HCL 5 MG/5ML PO SOLN: 5.0000 mg | Freq: Once | ORAL | Status: AC | PRN

## 2023-12-12 MED ORDER — SCOPOLAMINE 1 MG/3DAYS TD PT72
MEDICATED_PATCH | TRANSDERMAL | Status: AC
  Filled 2023-12-12: qty 1

## 2023-12-12 MED ORDER — MAGTRACE LYMPHATIC TRACER
INTRAMUSCULAR | Status: DC | PRN
  Administered 2023-12-12: 2 mL via INTRAMUSCULAR

## 2023-12-12 MED ORDER — LIDOCAINE HCL (CARDIAC) PF 100 MG/5ML IV SOSY
PREFILLED_SYRINGE | INTRAVENOUS | Status: DC | PRN
  Administered 2023-12-12: 40 mg via INTRAVENOUS

## 2023-12-12 MED ORDER — FENTANYL CITRATE (PF) 100 MCG/2ML IJ SOLN
INTRAMUSCULAR | Status: AC
Start: 2023-12-12 — End: 2023-12-12
  Filled 2023-12-12: qty 2

## 2023-12-12 MED ORDER — FENTANYL CITRATE (PF) 100 MCG/2ML IJ SOLN
25.0000 ug | INTRAMUSCULAR | Status: DC | PRN
  Administered 2023-12-12 (×3): 50 ug via INTRAVENOUS

## 2023-12-12 MED ORDER — OXYCODONE HCL 5 MG PO TABS
ORAL_TABLET | ORAL | Status: AC
  Filled 2023-12-12: qty 1

## 2023-12-12 MED ORDER — OXYCODONE HCL 5 MG PO TABS
5.0000 mg | ORAL_TABLET | Freq: Once | ORAL | Status: AC | PRN
  Administered 2023-12-12: 5 mg via ORAL

## 2023-12-12 MED ORDER — PROPOFOL 500 MG/50ML IV EMUL
INTRAVENOUS | Status: AC
  Filled 2023-12-12: qty 50

## 2023-12-12 MED ORDER — LIDOCAINE 2% (20 MG/ML) 5 ML SYRINGE
INTRAMUSCULAR | Status: AC
  Filled 2023-12-12: qty 5

## 2023-12-12 MED ORDER — 0.9 % SODIUM CHLORIDE (POUR BTL) OPTIME
TOPICAL | Status: DC | PRN
Start: 2023-12-12 — End: 2023-12-12
  Administered 2023-12-12: 120 mL

## 2023-12-12 MED ORDER — ONDANSETRON HCL 4 MG/2ML IJ SOLN
INTRAMUSCULAR | Status: AC
  Filled 2023-12-12: qty 2

## 2023-12-12 MED ORDER — BUPIVACAINE-EPINEPHRINE (PF) 0.5% -1:200000 IJ SOLN
INTRAMUSCULAR | Status: AC
  Filled 2023-12-12: qty 30

## 2023-12-12 MED ORDER — FENTANYL CITRATE (PF) 100 MCG/2ML IJ SOLN
INTRAMUSCULAR | Status: DC | PRN
  Administered 2023-12-12 (×2): 50 ug via INTRAVENOUS

## 2023-12-12 MED ORDER — ACETAMINOPHEN 10 MG/ML IV SOLN: 1000.0000 mg | Freq: Once | INTRAVENOUS | Status: DC | PRN

## 2023-12-12 MED ORDER — FENTANYL CITRATE (PF) 100 MCG/2ML IJ SOLN
50.0000 ug | Freq: Once | INTRAMUSCULAR | Status: AC
  Administered 2023-12-12: 50 ug via INTRAVENOUS

## 2023-12-12 MED ORDER — OXYCODONE HCL 5 MG PO TABS: 5.0000 mg | ORAL_TABLET | Freq: Four times a day (QID) | ORAL | 0 refills | Status: DC | PRN

## 2023-12-12 MED ORDER — CIPROFLOXACIN IN D5W 400 MG/200ML IV SOLN
INTRAVENOUS | Status: AC
  Filled 2023-12-12: qty 200

## 2023-12-12 SURGICAL SUPPLY — 39 items
BLADE SURG 15 STRL LF DISP TIS (BLADE) ×1 IMPLANT
CANISTER SUCT 1200ML W/VALVE (MISCELLANEOUS) IMPLANT
CHLORAPREP W/TINT 26 (MISCELLANEOUS) ×1 IMPLANT
CLIP APPLIE 9.375 MED OPEN (MISCELLANEOUS) ×1 IMPLANT
CLIP TI WIDE RED SMALL 6 (CLIP) IMPLANT
COVER BACK TABLE 60X90IN (DRAPES) ×1 IMPLANT
COVER MAYO STAND STRL (DRAPES) ×1 IMPLANT
COVER PROBE CYLINDRICAL 5X96 (MISCELLANEOUS) ×1 IMPLANT
DERMABOND ADVANCED .7 DNX12 (GAUZE/BANDAGES/DRESSINGS) ×1 IMPLANT
DRAPE LAPAROSCOPIC ABDOMINAL (DRAPES) ×1 IMPLANT
DRAPE UTILITY XL STRL (DRAPES) ×1 IMPLANT
ELECTRODE REM PT RTRN 9FT ADLT (ELECTROSURGICAL) ×1 IMPLANT
GAUZE SPONGE 4X4 12PLY STRL LF (GAUZE/BANDAGES/DRESSINGS) IMPLANT
GLOVE BIOGEL PI IND STRL 6.5 (GLOVE) IMPLANT
GLOVE BIOGEL PI IND STRL 7.0 (GLOVE) IMPLANT
GLOVE ECLIPSE 6.5 STRL STRAW (GLOVE) IMPLANT
GLOVE SURG SIGNA 7.5 PF LTX (GLOVE) ×1 IMPLANT
GLOVE SURG SS PI 6.5 STRL IVOR (GLOVE) IMPLANT
GOWN STRL REUS W/ TWL LRG LVL3 (GOWN DISPOSABLE) ×1 IMPLANT
GOWN STRL REUS W/ TWL XL LVL3 (GOWN DISPOSABLE) ×1 IMPLANT
KIT MARKER MARGIN INK (KITS) ×1 IMPLANT
NDL HYPO 25X1 1.5 SAFETY (NEEDLE) ×1 IMPLANT
NDL SAFETY ECLIPSE 18X1.5 (NEEDLE) ×1 IMPLANT
NEEDLE HYPO 25X1 1.5 SAFETY (NEEDLE) ×1 IMPLANT
NS IRRIG 1000ML POUR BTL (IV SOLUTION) ×1 IMPLANT
PACK BASIN DAY SURGERY FS (CUSTOM PROCEDURE TRAY) ×1 IMPLANT
PENCIL SMOKE EVACUATOR (MISCELLANEOUS) ×1 IMPLANT
SLEEVE SCD COMPRESS KNEE MED (STOCKING) ×1 IMPLANT
SPIKE FLUID TRANSFER (MISCELLANEOUS) IMPLANT
SPONGE T-LAP 4X18 ~~LOC~~+RFID (SPONGE) ×1 IMPLANT
SUT MNCRL AB 4-0 PS2 18 (SUTURE) ×1 IMPLANT
SUT SILK 2 0 SH (SUTURE) IMPLANT
SUT VIC AB 3-0 SH 27X BRD (SUTURE) ×1 IMPLANT
SYR CONTROL 10ML LL (SYRINGE) ×1 IMPLANT
TOWEL GREEN STERILE FF (TOWEL DISPOSABLE) ×1 IMPLANT
TRACER MAGTRACE VIAL (MISCELLANEOUS) IMPLANT
TRAY FAXITRON CT DISP (TRAY / TRAY PROCEDURE) ×1 IMPLANT
TUBE CONNECTING 20X1/4 (TUBING) IMPLANT
YANKAUER SUCT BULB TIP NO VENT (SUCTIONS) IMPLANT

## 2023-12-12 NOTE — Op Note (Signed)
 Allison Potts 12/12/2023   Pre-op Diagnosis: LEFT BREAST CANCER     Post-op Diagnosis: same  Procedure(s): RADIOACTIVE SEED GUIDED LEFT BREAST LUMPECTOMY DEEP LEFT AXILLARY SENTINEL LYMPH NODE BIOPSY INJECTION OF MAG TRACE FOR LYMPH NODE MAPPING  Surgeon(s): Vernetta Berg, MD  Anesthesia: General  Staff:  Circulator: Ethan Render DASEN, RN Scrub Person: Eliberto Geroge HERO, RN; Lelon Daphne BROCKS, RN Circulator Assistant: Jorja Arland HERO, RN  Estimated Blood Loss:                Specimens: SENT TO PATH  Indications: This is a 63 year old female who was found to have a spiculated mass in the left upper outer quadrant of the left breast on screening mammography.  A biopsy of the mass was positive for invasive ductal carcinoma as well as DCIS.  There were 2 slightly enlarged lymph nodes in the axilla on ultrasound.  1 was biopsied and was negative for malignancy.  The decision was made to proceed with a radioactive seed guided left breast lumpectomy and sentinel lymph node biopsy  Procedure: The patient was brought to the op room and identified the correct patient.  She was placed upon the operating table general esthesia was induced.  I next injected MAC trace underneath the left nipple-areolar complex massaged the breast.  Her left breast and axilla were then prepped and draped in usual sterile fashion.  Using the neoprobe I located the radioactive seed in the upper outer quadrant of the left breast.  I anesthetized skin over this area with Marcaine  and then made a longitudinal incision with a scalpel going toward the axilla.  I then dissected down to the breast tissue with the electrocautery.  With the aid of the neoprobe I then used electrocautery to dissect circumferentially around the signal.  I took the dissection all the way down the chest wall and it stayed posterior to the signal.  I then dissected medial to lateral and then completed the lumpectomy removing a large  specimen of breast tissue.  I marked all margins with paint.  An x-ray was performed on the specimen confirming at the radioactive seed and previous biopsy clip in the center of the specimen.  The lumpectomy specimen was then sent to pathology for evaluation.  Through the same incision I then dissected toward the left axilla.  I then dissected down to the deep axillary tissue.  With the aid of the mag trace probe and identified the lymph nodes.  There were at least 3 enlarged lymph nodes which I excised using the cautery and surgical clips.  These were sent to pathology for evaluation.  There was no other increased uptake of mag trace using the probe in the axilla and no other enlarged lymph nodes.  Hemostasis appeared to be achieved in the axilla.  I next placed more surgical clips around the periphery of the lumpectomy cavity for marking purposes.  I anesthetized the incision for this with Marcaine .  I then closed the deep tissue in the breast and axilla with interrupted 3-0 Vicryl sutures.  I then closed the subcutaneous tissue with interrupted 3-0 Vicryl sutures and closed the skin with a running 4-0 Monocryl.  Dermabond was then applied.  The patient tolerated the procedure well.  All the counts were correct at the end of the procedure.  The patient was then placed in a breast binder.  She was then extubated in the operating room and taken in a stable condition to the recovery room.  Allison Potts   Date: 12/12/2023  Time: 8:51 AM

## 2023-12-12 NOTE — Anesthesia Procedure Notes (Signed)
 Procedure Name: LMA Insertion Date/Time: 12/12/2023 7:40 AM  Performed by: Julieanne Fairy BROCKS, CRNAPre-anesthesia Checklist: Patient identified, Emergency Drugs available, Suction available and Patient being monitored Patient Re-evaluated:Patient Re-evaluated prior to induction Oxygen Delivery Method: Circle system utilized Preoxygenation: Pre-oxygenation with 100% oxygen Induction Type: IV induction Ventilation: Mask ventilation without difficulty LMA: LMA inserted LMA Size: 4.0 Number of attempts: 1 Airway Equipment and Method: Bite block Placement Confirmation: positive ETCO2 Tube secured with: Tape Dental Injury: Teeth and Oropharynx as per pre-operative assessment

## 2023-12-12 NOTE — Anesthesia Postprocedure Evaluation (Signed)
 Anesthesia Post Note  Patient: Allison Potts  Procedure(s) Performed: BREAST LUMPECTOMY WITH RADIOACTIVE SEED AND SENTINEL LYMPH NODE BIOPSY (Left: Breast)     Patient location during evaluation: PACU Anesthesia Type: General Level of consciousness: awake and alert Pain management: pain level controlled Vital Signs Assessment: post-procedure vital signs reviewed and stable Respiratory status: spontaneous breathing, nonlabored ventilation, respiratory function stable and patient connected to nasal cannula oxygen Cardiovascular status: blood pressure returned to baseline and stable Postop Assessment: no apparent nausea or vomiting Anesthetic complications: no   No notable events documented.  Last Vitals:  Vitals:   12/12/23 1006 12/12/23 1032  BP: (!) 153/88   Pulse: 83 78  Resp: 16 16  Temp: 36.6 C   SpO2: 95% 93%    Last Pain:  Vitals:   12/12/23 1006  TempSrc:   PainSc: 4                  Franky JONETTA Bald

## 2023-12-12 NOTE — Anesthesia Preprocedure Evaluation (Addendum)
 Anesthesia Evaluation  Patient identified by MRN, date of birth, ID band Patient awake    Reviewed: Allergy & Precautions, NPO status , Patient's Chart, lab work & pertinent test results  Airway Mallampati: II  TM Distance: >3 FB Neck ROM: Full    Dental  (+) Teeth Intact, Dental Advisory Given   Pulmonary asthma , sleep apnea    breath sounds clear to auscultation       Cardiovascular hypertension, Pt. on medications  Rhythm:Regular Rate:Normal     Neuro/Psych  PSYCHIATRIC DISORDERS Anxiety Depression     Neuromuscular disease    GI/Hepatic negative GI ROS, Neg liver ROS,,,  Endo/Other  diabetes, Type 2, Oral Hypoglycemic Agents    Renal/GU negative Renal ROS     Musculoskeletal   Abdominal   Peds  Hematology   Anesthesia Other Findings   Reproductive/Obstetrics                              Anesthesia Physical Anesthesia Plan  ASA: 2  Anesthesia Plan: General   Post-op Pain Management: Tylenol  PO (pre-op)*, Toradol  IV (intra-op)* and Regional block*   Induction: Intravenous  PONV Risk Score and Plan: 4 or greater and Ondansetron , Dexamethasone , Midazolam  and Scopolamine  patch - Pre-op  Airway Management Planned: LMA  Additional Equipment: None  Intra-op Plan:   Post-operative Plan: Extubation in OR  Informed Consent: I have reviewed the patients History and Physical, chart, labs and discussed the procedure including the risks, benefits and alternatives for the proposed anesthesia with the patient or authorized representative who has indicated his/her understanding and acceptance.     Dental advisory given  Plan Discussed with: CRNA  Anesthesia Plan Comments:          Anesthesia Quick Evaluation

## 2023-12-12 NOTE — Discharge Instructions (Addendum)
 [facility] Office Phone Number [phone]  BREAST BIOPSY/ PARTIAL MASTECTOMY: POST OP INSTRUCTIONS  Always review your discharge instruction sheet given to you by the facility where your surgery was performed.  IF YOU HAVE DISABILITY OR FAMILY LEAVE FORMS, YOU MUST BRING THEM TO THE OFFICE FOR PROCESSING.  DO NOT GIVE THEM TO YOUR DOCTOR.  A prescription for pain medication may be given to you upon discharge.  Take your pain medication as prescribed, if needed.  If narcotic pain medicine is not needed, then you may take acetaminophen  (Tylenol ) or ibuprofen  (Advil ) as needed. Take your usually prescribed medications unless otherwise directed If you need a refill on your pain medication, please contact your pharmacy.  They will contact our office to request authorization.  Prescriptions will not be filled after 5pm or on week-ends. You should eat very light the first 24 hours after surgery, such as soup, crackers, pudding, etc.  Resume your normal diet the day after surgery. Most patients will experience some swelling and bruising in the breast.  Ice packs and a good support bra will help.  Swelling and bruising can take several days to resolve.  It is common to experience some constipation if taking pain medication after surgery.  Increasing fluid intake and taking a stool softener will usually help or prevent this problem from occurring.  A mild laxative (Milk of Magnesia or Miralax) should be taken according to package directions if there are no bowel movements after 48 hours. Unless discharge instructions indicate otherwise, you may remove your bandages 24-48 hours after surgery, and you may shower at that time.  You may have steri-strips (small skin tapes) in place directly over the incision.  These strips should be left on the skin for 7-10 days.  If your surgeon used skin glue on the incision, you may shower in 24 hours.  The glue will flake off over the next 2-3 weeks.  Any  sutures or staples will be removed at the office during your follow-up visit. ACTIVITIES:  You may resume regular daily activities (gradually increasing) beginning the next day.  Wearing a good support bra or sports bra minimizes pain and swelling.  You may have sexual intercourse when it is comfortable. You may drive when you no longer are taking prescription pain medication, you can comfortably wear a seatbelt, and you can safely maneuver your car and apply brakes. RETURN TO WORK:  ______________________________________________________________________________________ Allison Potts should see your doctor in the office for a follow-up appointment approximately two weeks after your surgery.  Your doctor's nurse will typically make your follow-up appointment when she calls you with your pathology report.  Expect your pathology report 2-3 business days after your surgery.  You may call to check if you do not hear from us  after three days. OTHER INSTRUCTIONS: YOU MAY REMOVE THE BINDER TOMORROW AND SHOWER AND THEN PUT THE BINDER BACK ON ICE PACK, TYLENOL , AND IBUPROFEN  ALSO FOR PAIN NO VIGOROUS ACTIVITY FOR ONE WEEK _______________________________________________________________________________________________ _____________________________________________________________________________________________________________________________________ _____________________________________________________________________________________________________________________________________ _____________________________________________________________________________________________________________________________________  WHEN TO CALL YOUR DOCTOR: Fever over 101.0 Nausea and/or vomiting. Extreme swelling or bruising. Continued bleeding from incision. Increased pain, redness, or drainage from the incision.  The clinic staff is available to answer your questions during regular business hours.  Please don't hesitate to call and ask  to speak to one of the nurses for clinical concerns.  If you have a medical emergency, go to the nearest emergency room or call 911.  A surgeon from [facility] is always on  call at the hospital.  For further questions, please visit [email or web address]    Post Anesthesia Home Care Instructions  Activity: Get plenty of rest for the remainder of the day. A responsible individual must stay with you for 24 hours following the procedure.  For the next 24 hours, DO NOT: -Drive a car -Advertising copywriter -Drink alcoholic beverages -Take any medication unless instructed by your physician -Make any legal decisions or sign important papers.  Meals: Start with liquid foods such as gelatin or soup. Progress to regular foods as tolerated. Avoid greasy, spicy, heavy foods. If nausea and/or vomiting occur, drink only clear liquids until the nausea and/or vomiting subsides. Call your physician if vomiting continues.  Special Instructions/Symptoms: Your throat may feel dry or sore from the anesthesia or the breathing tube placed in your throat during surgery. If this causes discomfort, gargle with warm salt water. The discomfort should disappear within 24 hours.  If you had a scopolamine  patch placed behind your ear for the management of post- operative nausea and/or vomiting:  1. The medication in the patch is effective for 72 hours, after which it should be removed.  Wrap patch in a tissue and discard in the trash. Wash hands thoroughly with soap and water. 2. You may remove the patch earlier than 72 hours if you experience unpleasant side effects which may include dry mouth, dizziness or visual disturbances. 3. Avoid touching the patch. Wash your hands with soap and water after contact with the patch.     No tylenol  until 1240pm

## 2023-12-12 NOTE — Progress Notes (Signed)
Assisted Dr. Hollis with left, pectoralis, ultrasound guided block. Side rails up, monitors on throughout procedure. See vital signs in flow sheet. Tolerated Procedure well. ?

## 2023-12-12 NOTE — Interval H&P Note (Signed)
 History and Physical Interval Note:no change in H and P  12/12/2023 7:05 AM  Allison Potts  has presented today for surgery, with the diagnosis of LEFT BREAST CANCER.  The various methods of treatment have been discussed with the patient and family. After consideration of risks, benefits and other options for treatment, the patient has consented to  Procedure(s): BREAST LUMPECTOMY WITH RADIOACTIVE SEED AND SENTINEL LYMPH NODE BIOPSY (Left) as a surgical intervention.  The patient's history has been reviewed, patient examined, no change in status, stable for surgery.  I have reviewed the patient's chart and labs.  Questions were answered to the patient's satisfaction.     Allison Potts

## 2023-12-12 NOTE — Transfer of Care (Signed)
 Immediate Anesthesia Transfer of Care Note  Patient: Allison Potts  Procedure(s) Performed: BREAST LUMPECTOMY WITH RADIOACTIVE SEED AND SENTINEL LYMPH NODE BIOPSY (Left: Breast)  Patient Location: PACU  Anesthesia Type:GA combined with regional for post-op pain  Level of Consciousness: sedated  Airway & Oxygen Therapy: Patient Spontanous Breathing and Patient connected to face mask oxygen  Post-op Assessment: Report given to RN and Post -op Vital signs reviewed and stable  Post vital signs: Reviewed and stable  Last Vitals:  Vitals Value Taken Time  BP 136/66 12/12/23 08:52  Temp    Pulse 86 12/12/23 08:56  Resp 11 12/12/23 08:56  SpO2 97 % 12/12/23 08:56  Vitals shown include unfiled device data.  Last Pain:  Vitals:   12/12/23 0634  TempSrc: Temporal  PainSc: 3       Patients Stated Pain Goal: 3 (12/12/23 9365)  Complications: No notable events documented.

## 2023-12-13 ENCOUNTER — Encounter (HOSPITAL_BASED_OUTPATIENT_CLINIC_OR_DEPARTMENT_OTHER): Payer: Self-pay | Admitting: Surgery

## 2023-12-13 ENCOUNTER — Telehealth: Payer: Self-pay

## 2023-12-13 DIAGNOSIS — Z170 Estrogen receptor positive status [ER+]: Secondary | ICD-10-CM

## 2023-12-13 NOTE — Telephone Encounter (Signed)
 [id number]- Effectiveness of Out-of Pocket Cost Communication and Financial Navigation (CostCOM) in Cancer Patients:    Spoke to patient following her interest in the above study. Patient is interested but has not received any planned orders for systemic therapy to be qualified for the study at this time. Will follow patient for any new information regarding her treatment plan.   Laury Quale, MPH  Clinical Research Coordinator

## 2023-12-16 ENCOUNTER — Ambulatory Visit: Payer: Self-pay | Admitting: Genetic Counselor

## 2023-12-16 DIAGNOSIS — Z1379 Encounter for other screening for genetic and chromosomal anomalies: Secondary | ICD-10-CM

## 2023-12-16 NOTE — Progress Notes (Signed)
 HPI:   Allison Potts was previously seen in the [facility] due to a personal and family history of cancer and concerns regarding a hereditary predisposition to cancer. Please refer to our prior cancer genetics clinic note for more information regarding our discussion, assessment and recommendations, at the time. Allison Potts recent genetic test results were disclosed to her, as were recommendations warranted by these results. These results and recommendations are discussed in more detail below.  CANCER HISTORY:  Oncology History  Malignant neoplasm of upper-outer quadrant of left breast in female, estrogen receptor positive (HCC)  10/28/2023 Mammogram   Suspicious 2.6 cm LEFT breast mass (2 o'clock 4 CMFN). Recommend ultrasound-guided core needle biopsy. Indeterminate LEFT axillary lymph node with cortical thickness of 4 mm. Recommend ultrasound-guided core needle biopsy.   11/12/2023 Pathology Results   Left breast needle core biopsy at 2 :00 4cmfn showed grade 2 IDC, LN tissue benign,   The tumor cells are NEGATIVE for Her2 (0-1+). Estrogen Receptor:  100%, POSITIVE, STRONG STAINING INTENSITY Progesterone Receptor:  100%, POSITIVE, STRONG STAINING INTENSITY Proliferation Marker Ki67:  5%    11/18/2023 Initial Diagnosis   Malignant neoplasm of upper-outer quadrant of left breast in female, estrogen receptor positive (HCC)   11/20/2023 Cancer Staging   Staging form: Breast, AJCC 8th Edition - Clinical stage from 11/20/2023: Stage IB (cT2, cN0, cM0, G2, ER+, PR+, HER2-) - Signed by Loretha Ash, MD on 11/20/2023 Stage prefix: Initial diagnosis Histologic grading system: 3 grade system Laterality: Left Staged by: Pathologist and managing physician Stage used in treatment planning: Yes National guidelines used in treatment planning: Yes Type of national guideline used in treatment planning: NCCN    Genetic Testing   [facility] CancerNext-Expanded Panel+RNA was Negative.  Report date is 11/30/2023.  The CancerNext-Expanded gene panel offered by [facility] and includes sequencing, rearrangement, and RNA analysis for the following 77 genes: AIP, ALK, APC, ATM, AXIN2, BAP1, BARD1, BMPR1A, BRCA1, BRCA2, BRIP1, CDC73, CDH1, CDK4, CDKN1B, CDKN2A, CEBPA, CHEK2, CTNNA1, DDX41, DICER1, ETV6, FH, FLCN, GATA2, LZTR1, MAX, MBD4, MEN1, MET, MLH1, MSH2, MSH3, MSH6, MUTYH, NF1, NF2, NTHL1, PALB2, PHOX2B, PMS2, POT1, PRKAR1A, PTCH1, PTEN, RAD51C, RAD51D, RB1, RET, RPS20, RUNX1, SDHA, SDHAF2, SDHB, SDHC, SDHD, SMAD4, SMARCA4, SMARCB1, SMARCE1, STK11, SUFU, TMEM127, TP53, TSC1, TSC2, VHL, and WT1 (sequencing and deletion/duplication); EGFR, HOXB13, KIT, MITF, PDGFRA, POLD1, and POLE (sequencing only); EPCAM and GREM1 (deletion/duplication only).       FAMILY HISTORY:  We obtained a detailed, 4-generation family history.  Significant diagnoses are listed below:      Family History  Problem Relation Age of Onset   Diabetes Mother     Heart disease Mother     Emphysema Mother     Heart disease Father     Diabetes Father     Breast cancer Sister 61   Lung cancer Brother 34        smoked, maternal half-brother   Breast cancer Maternal Aunt          bilateral   Breast cancer Paternal Aunt     Breast cancer Niece 43 - 65   Colon cancer Maternal Cousin 60 - 91                 Allison Potts is unaware of previous family history of genetic testing for hereditary cancer risks. Her daughter reports she was found to have Ashkenazi Jewish ancestry on her 23andme testing.   GENETIC TEST RESULTS:  The [facility] CancerNext-Expanded Panel found  no pathogenic mutations.  The CancerNext-Expanded gene panel offered by [facility] and includes sequencing, rearrangement, and RNA analysis for the following 77 genes: AIP, ALK, APC, ATM, AXIN2, BAP1, BARD1, BMPR1A, BRCA1, BRCA2, BRIP1, CDC73, CDH1, CDK4, CDKN1B, CDKN2A, CEBPA, CHEK2, CTNNA1, DDX41, DICER1, ETV6, FH, FLCN, GATA2, LZTR1, MAX,  MBD4, MEN1, MET, MLH1, MSH2, MSH3, MSH6, MUTYH, NF1, NF2, NTHL1, PALB2, PHOX2B, PMS2, POT1, PRKAR1A, PTCH1, PTEN, RAD51C, RAD51D, RB1, RET, RPS20, RUNX1, SDHA, SDHAF2, SDHB, SDHC, SDHD, SMAD4, SMARCA4, SMARCB1, SMARCE1, STK11, SUFU, TMEM127, TP53, TSC1, TSC2, VHL, and WT1 (sequencing and deletion/duplication); EGFR, HOXB13, KIT, MITF, PDGFRA, POLD1, and POLE (sequencing only); EPCAM and GREM1 (deletion/duplication only).   The test report has been scanned into EPIC and is located under the Molecular Pathology section of the Results Review tab.  A portion of the result report is included below for reference. Genetic testing reported out on 11/30/2023.     Even though a pathogenic variant was not identified, possible explanations for the cancer in the family may include: There may be no hereditary risk for cancer in the family. The cancers in [name] and/or her family may be due to other genetic or environmental factors. There may be a gene mutation in one of these genes that current testing methods cannot detect, but that chance is small. There could be another gene that has not yet been discovered, or that we have not yet tested, that is responsible for the cancer diagnoses in the family.  It is also possible there is a hereditary cause for the cancer in the family that Allison Potts did not inherit.  Therefore, it is important to remain in touch with cancer genetics in the future so that we can continue to offer Allison Potts the most up to date genetic testing.   ADDITIONAL GENETIC TESTING:  We discussed with Allison Potts that her genetic testing was fairly extensive.  If there are genes identified to increase cancer risk that can be analyzed in the future, we would be happy to discuss and coordinate this testing at that time.    CANCER SCREENING RECOMMENDATIONS:  Allison Potts test result is considered negative (normal).  This means that we have not identified a hereditary cause for her personal  and family history of cancer at this time.   An individual's cancer risk and medical management are not determined by genetic test results alone. Overall cancer risk assessment incorporates additional factors, including personal medical history, family history, and any available genetic information that may result in a personalized plan for cancer prevention and surveillance. Therefore, it is recommended she continue to follow the cancer management and screening guidelines provided by her oncology and primary healthcare provider.  RECOMMENDATIONS FOR FAMILY MEMBERS:   Since she did not inherit a mutation in a cancer predisposition gene included on this panel, her children could not have inherited a mutation from her in one of these genes. Individuals in this family might be at some increased risk of developing cancer, over the general population risk, due to the family history of cancer. We recommend women in this family have a yearly mammogram beginning at age 55, or 20 years younger than the earliest onset of cancer, an annual clinical breast exam, and perform monthly breast self-exams. Other members of the family may still carry a pathogenic variant in one of these genes that Ms. Glad did not inherit. Based on the family history, we recommend her family members who have a history of cancer pursue genetic counseling and  testing.   FOLLOW-UP:  Cancer genetics is a rapidly advancing field and it is possible that new genetic tests will be appropriate for her and/or her family members in the future. We encouraged her to remain in contact with cancer genetics on an annual basis so we can update her personal and family histories and let her know of advances in cancer genetics that may benefit this family.   Our contact number was provided. Ms. Gin questions were answered to her satisfaction, and she knows she is welcome to call us  at anytime with additional questions or concerns.   Jamol Ginyard,  MS, [facility] Genetic Counselor [email or web address] (P) [phone]

## 2023-12-18 LAB — SURGICAL PATHOLOGY

## 2023-12-19 ENCOUNTER — Telehealth: Payer: Self-pay | Admitting: *Deleted

## 2023-12-19 ENCOUNTER — Encounter: Payer: Self-pay | Admitting: *Deleted

## 2023-12-19 NOTE — Telephone Encounter (Signed)
Ordered oncoytpe per Dr. Al Pimple. Sent requisition to pathology and exact sciences.

## 2023-12-27 ENCOUNTER — Encounter (HOSPITAL_COMMUNITY): Payer: Self-pay

## 2023-12-31 ENCOUNTER — Ambulatory Visit

## 2023-12-31 NOTE — Therapy (Signed)
 OUTPATIENT PHYSICAL THERAPY BREAST CANCER POST OP FOLLOW UP   Patient Name: Allison Potts MRN: [id number] DOB:03-10-1961, 63 y.o., female Today's Date: 01/01/2024  END OF SESSION:  PT End of Session - 01/01/24 1210     Visit Number 2    Number of Visits 2    Date for PT Re-Evaluation 01/15/24    PT Start Time 1210   late   PT Stop Time 1248    PT Time Calculation (min) 38 min    Activity Tolerance Patient tolerated treatment well    Behavior During Therapy [facility] for tasks assessed/performed          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   Past Surgical History:  Procedure Laterality Date   APPENDECTOMY     BREAST BIOPSY Left 11/12/2023   US  LT BREAST BX W LOC DEV 1ST LESION IMG BX SPEC US  GUIDE 11/12/2023 [facility]AMMO[facility]AP[facility]   BREAST BIOPSY Left 12/10/2023   US  LT RADIOACTIVE SEED LOC 12/10/2023 [facility] MAMMOGRAP[facility]   BREAST LUMPECTOMY WITH RADIOACTIVE SEED AND SENTINEL LYMPH NODE BIOPSY Left 12/12/2023   Procedure: BREAST LUMPECTOMY WITH RADIOACTIVE SEED AND SENTINEL LYMPH NODE BIOPSY;  Surgeon: Vernetta Berg, MD;  Location: [facility];  Service: General;  Laterality: Left;   THYROID  LOBECTOMY     TUBAL LIGATION     Patient Active Problem List   Diagnosis Date Noted   Genetic testing 12/02/2023   Malignant neoplasm of upper-outer quadrant of left breast in female, estrogen receptor positive (HCC) 11/18/2023   Chest pain 08/01/2021   Hypokalemia 08/01/2021   Refusal of blood transfusions as patient is Jehovah's Witness 05/31/2021   Atrial fibrillation (HCC) 05/29/2021   Prediabetes 05/29/2021   Primary  hypertension 03/21/2020   Anxiety and depression 12/19/2015   Fibromyalgia 02/07/2012   Obstructive sleep apnea (adult) (pediatric) 07/31/2011    PCP:   REFERRING PROVIDER: Berg Vernetta, MD  REFERRING DIAG: Left Breast Cancer  THERAPY DIAG:  Malignant neoplasm of upper-outer quadrant of left breast in female, estrogen receptor positive (HCC)  Abnormal posture  Rationale for Evaluation and Treatment: Rehabilitation  ONSET DATE: 07/11/2023  SUBJECTIVE:  SUBJECTIVE STATEMENT: Sugery went well. I do get intermittent sharp pains in the breast several times a day that lasts a couple minutes and then goes away. I wear the bra 24/7 and remove to bathe. I feel a lot of tightness in the breast. She has been moving her arm, but not doing the exercises given. I watched the Cancer video already and I don't have any questions. I see the surgeon on Sept 15. I don't think I want to do radiation.  PERTINENT HISTORY:  Patient was diagnosed on 07/11/2023 with left grade 2 invasive ductal carcinoma breast cancer. It measures 2.6 cm and is located in the upper outer quadrant. It is ER/PR positive and HER2 negative with a Ki67 of 5%. She has a history of thyroid  cancer with a partial thyroidectomy. She is s/p  Left Breast lumpectomy with deep left axillary SLNB on 12/12/2023 and 0+/5 LN's. She has not met with oncology yet re;radiation  PATIENT GOALS:  Reassess how my recovery is going related to arm function, pain, and swelling.  PAIN:  Are you having pain? No  PRECAUTIONS: Recent Surgery, left UE Lymphedema risk,   RED FLAGS: None   ACTIVITY LEVEL / LEISURE: still doing most things that she was but is cautious with left UE. I have an elliptical and I would like to start that today if possible.   OBJECTIVE:    PATIENT SURVEYS:  QUICK DASH: 43%  OBSERVATIONS: Long incision at lateral breast; scabbing noted and glue still present, no visible swelling  POSTURE:  Forward head, rounded shoulders  LYMPHEDEMA ASSESSMENT: ( pt performed 5 reps of wall slides for flex and abduction prior to measuring)  UPPER EXTREMITY AROM/PROM:   A/PROM RIGHT   eval    Shoulder extension 62  Shoulder flexion 161  Shoulder abduction 169  Shoulder internal rotation 50  Shoulder external rotation 90                          (Blank rows = not tested)   A/PROM LEFT   eval LEFT 01/01/2024  Shoulder extension 53 63  Shoulder flexion 128 but limited by breast pain from biopsy 163  Shoulder abduction 152 but limited by breast pain from biopsy 161  Shoulder internal rotation 74 70  Shoulder external rotation 84 104                          (Blank rows = not tested)   CERVICAL AROM: All within normal limits   UPPER EXTREMITY STRENGTH: WNL   LYMPHEDEMA ASSESSMENTS (in cm):    LANDMARK RIGHT   eval  10 cm proximal to olecranon process 36.8  Olecranon process 28.8  10 cm proximal to ulnar styloid process 27  Just proximal to ulnar styloid process 18.2  Across hand at thumb web space 19.8  At base of 2nd digit 7  (Blank rows = not tested)   LANDMARK LEFT   eval LEFT 01/01/2024  10 cm proximal to olecranon process 37 37.5  Olecranon process 29.4 30.0  10 cm proximal to ulnar styloid process 25.9 26.4  Just proximal to ulnar styloid process 18.2 17.9  Across hand at thumb web space 20.2 20.5  At base of 2nd digit 6.6 6.5  (Blank rows = not tested)  Surgery type/Date: Left Breast lumpectomy with deep left axillary SLNB on 12/12/2023 Number //of lymph nodes removed: 0/5 Current/past treatment (chemo, radiation, hormone therapy): Does  not know which if any she will do. Has not met with oncology yet Other symptoms:  Heaviness/tightness No Pain Yes, intermittent sharp pains Pitting edema No Infections  No Decreased scar mobility Yes Stemmer sign No  PATIENT EDUCATION:  Education details: scar massage, SOZO screens, if doing radiation continue exs for duration to prevent tightness,briefly reviewed HEP and gave new copy of pictures, slow return to arm use on elliptical Person educated: Patient Education method: Explanation, Demonstration, and Handouts Education comprehension: verbalized understanding and returned demonstration  HOME EXERCISE PROGRAM: Reviewed previously given post op HEP and gave pt another copy.   ASSESSMENT:  CLINICAL IMPRESSION: Pt is s/p Left Breast lumpectomy with deep left axillary SLNB on 12/12/2023. She is making excellent progress and has only very minimal limitation in left shoulder abduction. There is no sign of lymphedema, and there is no sign of swelling in her breast. Incision is healing well with scabs still present, and glue. She will begin scar massage when scabs/glue is off. She has already watched the ABC video and has no further questions. She was advised to continue exercises for duration of radiation so she notices if she is getting any tightness in her shoulder. She was set up for her SOZO screen today. There are no further needs for formal therapy at this time, however, pt will call if she has questions or concerns.  Pt will benefit from skilled therapeutic intervention to improve on the following deficits: Decreased knowledge of precautions, impaired UE functional use, pain, decreased ROM, postural dysfunction.   PT treatment/interventions: ADL/Self care home management, [id number]- PT Re-evaluation, 97110-Therapeutic exercises, and 02464- Self Care   GOALS: Goals reviewed with patient? Yes  LONG TERM GOALS:  (STG=LTG)  GOALS Name Target Date  Goal status  1 Pt will demonstrate she has regained full shoulder ROM and function post operatively compared to baselines.  Baseline: 01/15/2024 MET 01/01/2024     PLAN:  PT FREQUENCY/DURATION: No further  needs identified  PLAN FOR NEXT SESSION: Pt is discharged from formal therapy. PHYSICAL THERAPY DISCHARGE SUMMARY  Visits from Start of Care: 2  Current functional level related to goals / functional outcomes: Achieved goals   Remaining deficits: NONE   Education / Equipment: HEP, scar massage, SOZO screens, video   Patient agrees to discharge. Patient goals were met. Patient is being discharged due to meeting the stated rehab goals.   [facility]  [address]  [place] [address]  [phone]  After Breast Cancer Class Video It is recommended you view the ABC class video to be educated on lymphedema risk reduction. This video lasts for about 30 minutes. It can be viewed on our website here: [email or web address]  Scar massage You can begin gentle scar massage to you incision sites. Gently place one hand on the incision and move the skin (without sliding on the skin) in various directions. Do this for a few minutes and then you can gently massage either coconut oil or vitamin E cream into the scars.  Compression garment You should continue wearing your compression bra until you feel like you no longer have swelling.  Home exercise Program Continue doing the exercises you were given until you feel like you can do them without feeling any tightness at the end.   Walking Program Studies show that 30 minutes of walking per day (fast enough to elevate your heart rate) can significantly reduce the risk of a cancer recurrence. If you can't walk due to other  medical reasons, we encourage you to find another activity you could do (like a stationary bike or water exercise).  Posture After breast cancer surgery, people frequently sit with rounded shoulders posture because it puts their incisions on slack and feels better. If you sit like this and scar tissue forms in that position, you  can become very tight and have pain sitting or standing with good posture. Try to be aware of your posture and sit and stand up tall to heal properly.  Follow up PT: It is recommended you return every 3 months for the first 3 years following surgery to be assessed on the SOZO machine for an L-Dex score. This helps prevent clinically significant lymphedema in 95% of patients. These follow up screens are 10 minute appointments that you are not billed for.  Grayce JINNY Sheldon, PT 01/01/2024, 12:49 PM

## 2024-01-01 ENCOUNTER — Ambulatory Visit: Attending: Surgery

## 2024-01-01 DIAGNOSIS — R293 Abnormal posture: Secondary | ICD-10-CM | POA: Diagnosis present

## 2024-01-01 DIAGNOSIS — C50412 Malignant neoplasm of upper-outer quadrant of left female breast: Secondary | ICD-10-CM | POA: Insufficient documentation

## 2024-01-01 DIAGNOSIS — Z170 Estrogen receptor positive status [ER+]: Secondary | ICD-10-CM | POA: Diagnosis present

## 2024-01-01 NOTE — Patient Instructions (Signed)
 [facility]  [address], [address]  [place] [address]  [phone]  After Breast Cancer Class Video It is recommended you view the ABC class video to be educated on lymphedema risk reduction. This video lasts for about 30 minutes. It can be viewed on our website here: [email or web address]  Scar massage You can begin gentle scar massage to you incision sites. Gently place one hand on the incision and move the skin (without sliding on the skin) in various directions. Do this for a few minutes and then you can gently massage either coconut oil or vitamin E cream into the scars.  Compression garment You should continue wearing your compression bra until you feel like you no longer have swelling.  Home exercise Program Continue doing the exercises you were given until you feel like you can do them without feeling any tightness at the end.   Walking Program Studies show that 30 minutes of walking per day (fast enough to elevate your heart rate) can significantly reduce the risk of a cancer recurrence. If you can't walk due to other medical reasons, we encourage you to find another activity you could do (like a stationary bike or water exercise).  Posture After breast cancer surgery, people frequently sit with rounded shoulders posture because it puts their incisions on slack and feels better. If you sit like this and scar tissue forms in that position, you can become very tight and have pain sitting or standing with good posture. Try to be aware of your posture and sit and stand up tall to heal properly.  Follow up PT: It is recommended you return every 3 months for the first 3 years following surgery to be assessed on the SOZO machine for an L-Dex score. This helps prevent clinically significant lymphedema in 95% of patients. These follow up screens are 10 minute appointments that you are not billed  for.

## 2024-01-02 ENCOUNTER — Encounter: Payer: Self-pay | Admitting: *Deleted

## 2024-01-02 ENCOUNTER — Telehealth: Payer: Self-pay | Admitting: *Deleted

## 2024-01-02 DIAGNOSIS — Z170 Estrogen receptor positive status [ER+]: Secondary | ICD-10-CM

## 2024-01-02 NOTE — Telephone Encounter (Signed)
 Received oncotype results of 5/3%. Referral placed for Dr. Shannon

## 2024-01-03 ENCOUNTER — Other Ambulatory Visit: Payer: Self-pay | Admitting: "Endocrinology

## 2024-01-07 ENCOUNTER — Inpatient Hospital Stay: Attending: Hematology and Oncology | Admitting: Hematology and Oncology

## 2024-01-07 VITALS — BP 152/72 | HR 70 | Temp 98.2°F | Resp 20 | Wt 232.5 lb

## 2024-01-07 DIAGNOSIS — Z1721 Progesterone receptor positive status: Secondary | ICD-10-CM | POA: Diagnosis not present

## 2024-01-07 DIAGNOSIS — Z803 Family history of malignant neoplasm of breast: Secondary | ICD-10-CM | POA: Diagnosis not present

## 2024-01-07 DIAGNOSIS — Z8249 Family history of ischemic heart disease and other diseases of the circulatory system: Secondary | ICD-10-CM | POA: Insufficient documentation

## 2024-01-07 DIAGNOSIS — Z170 Estrogen receptor positive status [ER+]: Secondary | ICD-10-CM | POA: Insufficient documentation

## 2024-01-07 DIAGNOSIS — Z801 Family history of malignant neoplasm of trachea, bronchus and lung: Secondary | ICD-10-CM | POA: Diagnosis not present

## 2024-01-07 DIAGNOSIS — Z17411 Hormone receptor positive with human epidermal growth factor receptor 2 negative status: Secondary | ICD-10-CM | POA: Insufficient documentation

## 2024-01-07 DIAGNOSIS — C50412 Malignant neoplasm of upper-outer quadrant of left female breast: Secondary | ICD-10-CM | POA: Insufficient documentation

## 2024-01-07 DIAGNOSIS — Z79899 Other long term (current) drug therapy: Secondary | ICD-10-CM | POA: Insufficient documentation

## 2024-01-07 DIAGNOSIS — Z7985 Long-term (current) use of injectable non-insulin antidiabetic drugs: Secondary | ICD-10-CM | POA: Insufficient documentation

## 2024-01-07 DIAGNOSIS — Z800 Family history of malignant neoplasm of digestive organs: Secondary | ICD-10-CM | POA: Diagnosis not present

## 2024-01-07 DIAGNOSIS — Z1732 Human epidermal growth factor receptor 2 negative status: Secondary | ICD-10-CM | POA: Diagnosis not present

## 2024-01-07 NOTE — Assessment & Plan Note (Signed)
 Assessment and Plan Assessment & Plan

## 2024-01-07 NOTE — Progress Notes (Signed)
 [facility] CONSULT NOTE  Patient Care Team: Leontine Cramp, NP as PCP - General (Nurse Practitioner) Kate Lonni CROME, MD as PCP - Cardiology (Cardiology) Tyree Nanetta SAILOR, RN as Oncology Nurse Navigator Gerome, Devere HERO, RN as Oncology Nurse Navigator Vernetta Berg, MD as Consulting Physician (General Surgery) Loretha Ash, MD as Consulting Physician (Hematology and Oncology) Shannon Agent, MD as Consulting Physician (Radiation Oncology)  CHIEF COMPLAINTS/PURPOSE OF CONSULTATION:  Newly diagnosed breast cancer  HISTORY OF PRESENTING ILLNESS:  Allison Potts 63 y.o. female is here because of recent diagnosis of left breast IDC  I reviewed her records extensively and collaborated the history with the patient.  SUMMARY OF ONCOLOGIC HISTORY: Oncology History  Malignant neoplasm of upper-outer quadrant of left breast in female, estrogen receptor positive (HCC)  10/28/2023 Mammogram   Suspicious 2.6 cm LEFT breast mass (2 o'clock 4 CMFN). Recommend ultrasound-guided core needle biopsy. Indeterminate LEFT axillary lymph node with cortical thickness of 4 mm. Recommend ultrasound-guided core needle biopsy.   11/12/2023 Pathology Results   Left breast needle core biopsy at 2 :00 4cmfn showed grade 2 IDC, LN tissue benign,   The tumor cells are NEGATIVE for Her2 (0-1+). Estrogen Receptor:  100%, POSITIVE, STRONG STAINING INTENSITY Progesterone Receptor:  100%, POSITIVE, STRONG STAINING INTENSITY Proliferation Marker Ki67:  5%    11/18/2023 Initial Diagnosis   Malignant neoplasm of upper-outer quadrant of left breast in female, estrogen receptor positive (HCC)   11/20/2023 Cancer Staging   Staging form: Breast, AJCC 8th Edition - Clinical stage from 11/20/2023: Stage IB (cT2, cN0, cM0, G2, ER+, PR+, HER2-) - Signed by Loretha Ash, MD on 11/20/2023 Stage prefix: Initial diagnosis Histologic grading system: 3 grade system Laterality: Left Staged by: Pathologist  and managing physician Stage used in treatment planning: Yes National guidelines used in treatment planning: Yes Type of national guideline used in treatment planning: NCCN    Genetic Testing   [facility] CancerNext-Expanded Panel+RNA was Negative. Report date is 11/30/2023.  The CancerNext-Expanded gene panel offered by [facility] and includes sequencing, rearrangement, and RNA analysis for the following 77 genes: AIP, ALK, APC, ATM, AXIN2, BAP1, BARD1, BMPR1A, BRCA1, BRCA2, BRIP1, CDC73, CDH1, CDK4, CDKN1B, CDKN2A, CEBPA, CHEK2, CTNNA1, DDX41, DICER1, ETV6, FH, FLCN, GATA2, LZTR1, MAX, MBD4, MEN1, MET, MLH1, MSH2, MSH3, MSH6, MUTYH, NF1, NF2, NTHL1, PALB2, PHOX2B, PMS2, POT1, PRKAR1A, PTCH1, PTEN, RAD51C, RAD51D, RB1, RET, RPS20, RUNX1, SDHA, SDHAF2, SDHB, SDHC, SDHD, SMAD4, SMARCA4, SMARCB1, SMARCE1, STK11, SUFU, TMEM127, TP53, TSC1, TSC2, VHL, and WT1 (sequencing and deletion/duplication); EGFR, HOXB13, KIT, MITF, PDGFRA, POLD1, and POLE (sequencing only); EPCAM and GREM1 (deletion/duplication only).      Discussed the use of AI scribe software for clinical note transcription with the patient, who gave verbal consent to proceed.  History of Present Illness  Allison Potts is a 63 year old female with breast cancer who presents for follow-up after surgery.  She underwent surgery for breast cancer, where a tumor measuring 1.8 cm was removed. The surgical margins were clear, and five lymph nodes were excised, all of which were negative for cancer.  She was diagnosed with COVID-19 two weeks ago and continues to experience a persistent cough. She is wearing a mask during the visit as a precaution.  She has a history of severe depression, which she manages with coping skills and emergency outreach when necessary. She has not experienced suicidal thoughts since 2012 and is aware of her triggers, particularly related to sleep patterns. She mentions a previous psychiatrist who moved  away, and she has  not sought a new one since.  She is considering a plant-based diet, incorporating more greens and reducing meat intake. She expresses interest in homeopathic remedies and emphasizes the importance of mental well-being, stress management, and faith in her overall health strategy.    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    SURGICAL HISTORY: Past Surgical History:  Procedure Laterality Date   APPENDECTOMY     BREAST BIOPSY Left 11/12/2023   US  LT BREAST BX W LOC DEV 1ST LESION IMG BX SPEC US  GUIDE 11/12/2023 [facility]MO[facility]   BREAST BIOPSY Left 12/10/2023   US  LT RADIOACTIVE SEED LOC 12/10/2023 [facility] MAMMOGRAP[facility]   BREAST LUMPECTOMY WITH RADIOACTIVE SEED AND SENTINEL LYMPH NODE BIOPSY Left 12/12/2023   Procedure: BREAST LUMPECTOMY WITH RADIOACTIVE SEED AND SENTINEL LYMPH NODE BIOPSY;  Surgeon: Vernetta Berg, MD;  Location: [facility];  Service: General;  Laterality: Left;   THYROID  LOBECTOMY     TUBAL LIGATION      SOCIAL HISTORY: Social History   Socioeconomic History   Marital status: Married    Spouse name: Not on file   Number of children: Not on file   Years of education: Not on file   Highest education level: Not on file  Occupational History   Not on file  Tobacco Use   Smoking status: Never   Smokeless tobacco: Never  Vaping Use   Vaping status: Never Used  Substance and Sexual Activity   Alcohol use: Not Currently   Drug use: Never   Sexual activity: Yes    Birth control/protection: Surgical, Post-menopausal    Comment: BTL  Other Topics Concern   Not on file  Social History  Narrative   Not on file   Social Drivers of Health   Financial Resource Strain: Low Risk  (05/25/2021)   Received from [facility] visits prior to 06/30/2022.   Overall Financial Resource Strain (CARDIA)    Difficulty of Paying Living Expenses: Not very hard  Food Insecurity: No Food Insecurity (11/20/2023)   Hunger Vital Sign    Worried About Running Out of Food in the Last Year: Never true    Ran Out of Food in the Last Year: Never true  Transportation Needs: No Transportation Needs (08/08/2023)   PRAPARE - Administrator, Civil Service (Medical): No    Lack of Transportation (Non-Medical): No  Physical Activity: Insufficiently Active (05/25/2021)   Received from [facility] visits prior to 06/30/2022.   Exercise Vital Sign    On average, how many days per week do you engage in moderate to strenuous exercise (like a brisk walk)?: 5 days    On average, how many minutes do you engage in exercise at this level?: 20 min  Stress: Stress Concern Present (05/25/2021)   Received from [facility] visits prior to 06/30/2022.   [employer] of Occupational Health - Occupational Stress Questionnaire    Feeling  of Stress : Very much  Social Connections: Unknown (11/30/2022)   Received from [facility]   Social Network    Social Network: Not on file  Intimate Partner Violence: Not At Risk (11/20/2023)   Humiliation, Afraid, Rape, and Kick questionnaire    Fear of Current or Ex-Partner: No    Emotionally Abused: No    Physically Abused: No    Sexually Abused: No    FAMILY HISTORY: Family History  Problem Relation Age of Onset   Diabetes Mother    Heart disease Mother    Emphysema Mother    Heart disease Father    Diabetes Father    Breast cancer Sister 76   Lung cancer Brother 34       smoked, maternal half-brother   Breast cancer Maternal Aunt        bilateral   Breast cancer Paternal Aunt    Breast cancer  Niece 50 - 51   Colon cancer Maternal Cousin 34 - 51    ALLERGIES:  is allergic to penicillins and antihistamines, diphenhydramine-type.  MEDICATIONS:  Current Outpatient Medications  Medication Sig Dispense Refill   clotrimazole -betamethasone  (LOTRISONE ) cream Apply 1 Application topically 2 (two) times daily. 30 g 0   fluticasone  (FLONASE ) 50 MCG/ACT nasal spray Place 2 sprays into both nostrils daily as needed for allergies.     levocetirizine (XYZAL) 5 MG tablet Take 5 mg by mouth at bedtime.     losartan  (COZAAR ) 100 MG tablet Take 100 mg by mouth daily.     Omega-3 Fatty Acids (FISH OIL) 1000 MG CPDR Take by mouth.     oxyCODONE  (OXY IR/ROXICODONE ) 5 MG immediate release tablet Take 1 tablet (5 mg total) by mouth every 6 (six) hours as needed for moderate pain (pain score 4-6) or severe pain (pain score 7-10). 25 tablet 0   QUEtiapine  (SEROQUEL ) 50 MG tablet Take 200 mg by mouth at bedtime.     rosuvastatin  (CRESTOR ) 10 MG tablet Take 2 tablets (20 mg total) by mouth daily. 90 tablet 1   VITAMIN D, ERGOCALCIFEROL, PO Take by mouth.     OZEMPIC , 0.25 OR 0.5 MG/DOSE, 2 MG/3ML SOPN INJECT 0.25 MG INTO THE SKIN ONCE A WEEK (Patient not taking: Reported on 01/07/2024) 3 mL 0   No current facility-administered medications for this visit.    PHYSICAL EXAMINATION: ECOG PERFORMANCE STATUS: 0 - Asymptomatic  Vitals:   01/07/24 1357  BP: (!) 152/72  Pulse: 70  Resp: 20  Temp: 98.2 F (36.8 C)  SpO2: 97%   Filed Weights   01/07/24 1357  Weight: 232 lb 8 oz (105.5 kg)    GENERAL:alert, no distress and comfortable  LABORATORY DATA:  I have reviewed the data as listed Lab Results  Component Value Date   WBC 7.1 11/20/2023   HGB 13.4 11/20/2023   HCT 39.3 11/20/2023   MCV 88.9 11/20/2023   PLT 250 11/20/2023   Lab Results  Component Value Date   NA 139 11/20/2023   K 3.9 11/20/2023   CL 105 11/20/2023   CO2 30 11/20/2023    RADIOGRAPHIC STUDIES: I have personally  reviewed the radiological reports and agreed with the findings in the report.  ASSESSMENT AND PLAN:   Assessment and Plan Assessment & Plan Breast cancer, left upper-outer quadrant, post-surgical, no chemotherapy planned Post-surgical status with clear margins and negative lymph nodes. Low recurrence risk based on oncotype no chemotherapy planned. She preferred lifestyle modifications over radiation and hormone therapy. Explained benefits  and risks of radiation and hormone therapy  - Coordinate with Dr. Burlene for discussion on radiation therapy. - Discussed about several options for anti estrogen therapy, increased risk of systemic recurrence which may be incurable. - She understands the benefits and risks and at this time wants to focus on reike, life style interventions and FU with annual mammogram. - Schedule annual mammogram for June 2026.   Time spent: 30  min including history, physical exam, review of records, counseling and coordination of care. All questions were answered. The patient knows to call the clinic with any problems, questions or concerns.    Amber Stalls, MD 01/07/24

## 2024-01-08 ENCOUNTER — Telehealth: Payer: Self-pay | Admitting: Radiation Oncology

## 2024-01-08 NOTE — Telephone Encounter (Signed)
 Patient was contacted to schedule appointment, but patient states that she is no longer taking the Ozempic  so she does not need an appointment.

## 2024-01-08 NOTE — Telephone Encounter (Signed)
 9/10 Called patient to be sch for follow up consult.  Patient at this time not sure about radiation treatments.  She stated will call back.  Secure chat sent to Nursing and Leeroy, so they are aware.

## 2024-01-09 ENCOUNTER — Encounter: Payer: Self-pay | Admitting: *Deleted

## 2024-01-09 DIAGNOSIS — Z170 Estrogen receptor positive status [ER+]: Secondary | ICD-10-CM

## 2024-01-09 NOTE — Research (Signed)
 [id number]- Effectiveness of Out-of Pocket Cost Communication and Financial Navigation (CostCOM) in Cancer Patients:    Patient not receiving systemic treatment. Not eligible to participate in this financial navigation study. Dr. Loretha notified.  Laury Quale, MPH  Clinical Research Coordinator

## 2024-01-13 NOTE — Progress Notes (Signed)
 Location of Breast Cancer:{:21944} ***  Histology per Pathology Report:    Receptor Status: ER(***), PR (***), Her2-neu (***), Ki-67(***)  Did patient present with symptoms (if so, please note symptoms) or was this found on screening mammography?: ***  Past/Anticipated interventions by surgeon, if any:{t:21944} ***  Past/Anticipated interventions by medical oncology, if any: Chemotherapy ***  Lymphedema issues, if any:  {:18581} {t:21944}   Pain issues, if any:  {:18581} {PAIN DESCRIPTION:21022940}  SAFETY ISSUES: Prior radiation? {:18581} Pacemaker/ICD? {:18581} Possible current pregnancy?{:18581} Is the patient on methotrexate? {:18581}  Current Complaints / other details:  ***

## 2024-01-13 NOTE — Progress Notes (Incomplete)
 Radiation Oncology         [phone] ________________________________  Name: Allison Potts MRN: [id number]  Date: 01/14/2024  DOB: 18-Jan-1961  Re-Evaluation Note  CC: Allison Cramp, NP  Allison Ash, MD  FUN visit Conducted via telephone at patient request.   I spoke with the patient to conduct this consult visit via telephone. The patient was notified in advance and was offered an in person or telemedicine meeting to allow for face to face communication but instead preferred to proceed with a telephone consult.    This encounter was conducted via telephone.    The patient has provided two factor identification and has given verbal consent for this type of encounter and has been advised to only accept a meeting of this type in a secure network environment.    The time spent during this encounter was 60 minutes including preparation, discussion, and coordination of the patient's care    The attendants for this meeting include Lynwood CHARM Nasuti MD, patient, Hadassah Collum, no family members were present for this consultation.   During the encounter, Lynwood CHARM Nasuti MD was located at [facility] Radiation Oncology Department.  Patient, Allison Potts was located at ***.   Diagnosis: Stage IB Left Breast UOQ, Invasive and in situ ductal carcinoma, ER+ / PR+ / Her2-, Grade 2   Cancer Staging  Malignant neoplasm of upper-outer quadrant of left breast in female, estrogen receptor positive (HCC) Staging form: Breast, AJCC 8th Edition - Clinical stage from 11/20/2023: Stage IB (cT2, cN0, cM0, G2, ER+, PR+, HER2-) - Signed by Allison Ash, MD on 11/20/2023   Narrative:  The patient returns today to discuss radiation treatment options. She was seen in the multidisciplinary breast clinic on 11/20/23.   Since consultation, she underwent genetic testing on 12/02/23. Results showed no pathogenic mutations contributing to her recent diagnosis.  Patient opted to proceed with a left  breast lumpectomy/mastectomy with radioactive seed and sentinel lymph node biopsy on 12/12/23 under the care of Dr. Vernetta. Pathology from the procedure revealed: tumor size of 1.8 cm with histology of grade 2 invasive ductal carcinoma and intermediate DCIS. Resection margins are negative for carcinoma; closest are the anterior and posterior margins at 0.2 cm. All examined lymph nodes are negative for carcinoma. Prognostic indicators significant for: estrogen receptor 100%, positive, strong staining intensity; progesterone receptor 100%, positive, strong staining intensity; Proliferation marker Ki67 at 5%; Her2 status Negative (0-1+); Grade 2.    Oncotype DX was obtained on the final surgical sample and the recurrence score of 5 predicts a risk of recurrence outside the breast over the next 9 years of 3%, if the patient's only systemic therapy is an antiestrogen for 5 years.  It also predicts no significant benefit from chemotherapy.  In the interval since she was seen, she presented at the UC on 8/28 complaining of an acute cough. Covid test at that time was negative. She returned to the UC on 9/7 for the same symptoms, repeat Covid test was positive. Xray showed no evidence of acute cardiac or pulmonary abnormality.     On review of systems, the patient reports ***. She denies *** and any other symptoms.    Allergies:  is allergic to penicillins and antihistamines, diphenhydramine-type.  Meds: Current Outpatient Medications  Medication Sig Dispense Refill   clotrimazole -betamethasone  (LOTRISONE ) cream Apply 1 Application topically 2 (two) times daily. 30 g 0   fluticasone  (FLONASE ) 50 MCG/ACT nasal spray Place 2 sprays into both nostrils daily  as needed for allergies.     levocetirizine (XYZAL) 5 MG tablet Take 5 mg by mouth at bedtime.     losartan  (COZAAR ) 100 MG tablet Take 100 mg by mouth daily.     Omega-3 Fatty Acids (FISH OIL) 1000 MG CPDR Take by mouth.     oxyCODONE  (OXY IR/ROXICODONE )  5 MG immediate release tablet Take 1 tablet (5 mg total) by mouth every 6 (six) hours as needed for moderate pain (pain score 4-6) or severe pain (pain score 7-10). 25 tablet 0   OZEMPIC , 0.25 OR 0.5 MG/DOSE, 2 MG/3ML SOPN INJECT 0.25 MG INTO THE SKIN ONCE A WEEK (Patient not taking: Reported on 01/07/2024) 3 mL 0   QUEtiapine  (SEROQUEL ) 50 MG tablet Take 200 mg by mouth at bedtime.     rosuvastatin  (CRESTOR ) 10 MG tablet Take 2 tablets (20 mg total) by mouth daily. 90 tablet 1   VITAMIN D, ERGOCALCIFEROL, PO Take by mouth.     No current facility-administered medications for this visit.    Physical Findings: The patient is in no acute distress. Patient is alert and oriented.  vitals were not taken for this visit.  No significant changes. Lungs are clear to auscultation bilaterally. Heart has regular rate and rhythm. No palpable cervical, supraclavicular, or axillary adenopathy. Abdomen soft, non-tender, normal bowel sounds. *** Breast: no palpable mass, nipple discharge or bleeding. *** Breast: ***  Lab Findings: Lab Results  Component Value Date   WBC 7.1 11/20/2023   HGB 13.4 11/20/2023   HCT 39.3 11/20/2023   MCV 88.9 11/20/2023   PLT 250 11/20/2023    Radiographic Findings: No results found.  Impression:  Stage IB Left Breast UOQ, Invasive and in situ ductal carcinoma, ER+ / PR+ / Her2-, Grade 2  ***  Plan:  Patient is scheduled for CT simulation {date/later today}. ***  -----------------------------------  Lynwood CHARM Nasuti, PhD, MD   This document serves as a record of services personally performed by Lynwood Nasuti, MD. It was created on his behalf by Reymundo Cartwright, a trained medical scribe. The creation of this record is based on the scribe's personal observations and the provider's statements to them. This document has been checked and approved by the attending provider.

## 2024-01-14 ENCOUNTER — Ambulatory Visit
Admission: RE | Admit: 2024-01-14 | Discharge: 2024-01-14 | Disposition: A | Discharge status: Home / Self Care | Source: Ambulatory Visit | Attending: Radiation Oncology | Admitting: Radiation Oncology

## 2024-01-14 ENCOUNTER — Encounter: Payer: Self-pay | Admitting: Radiation Oncology

## 2024-01-14 DIAGNOSIS — C50412 Malignant neoplasm of upper-outer quadrant of left female breast: Secondary | ICD-10-CM

## 2024-01-21 ENCOUNTER — Telehealth: Payer: Self-pay | Admitting: Podiatry

## 2024-01-21 ENCOUNTER — Encounter: Payer: Self-pay | Admitting: *Deleted

## 2024-01-21 DIAGNOSIS — C50412 Malignant neoplasm of upper-outer quadrant of left female breast: Secondary | ICD-10-CM

## 2024-01-21 NOTE — Telephone Encounter (Signed)
 Patient left ankle pain and swelling would like to be seen rigt away, Patient is willing to go to [facility]

## 2024-01-22 ENCOUNTER — Other Ambulatory Visit: Payer: Self-pay | Admitting: Podiatry

## 2024-01-22 ENCOUNTER — Ambulatory Visit (INDEPENDENT_AMBULATORY_CARE_PROVIDER_SITE_OTHER)

## 2024-01-22 ENCOUNTER — Ambulatory Visit (INDEPENDENT_AMBULATORY_CARE_PROVIDER_SITE_OTHER): Admitting: Podiatry

## 2024-01-22 DIAGNOSIS — M7752 Other enthesopathy of left foot: Secondary | ICD-10-CM

## 2024-01-22 DIAGNOSIS — T148XXA Other injury of unspecified body region, initial encounter: Secondary | ICD-10-CM

## 2024-01-22 NOTE — Progress Notes (Signed)
 Subjective:  Patient ID: Allison Potts, female    DOB: 1960-08-21,  MRN: [id number]  Chief Complaint  Patient presents with   Foot Pain    Pt stated that yesterday she was in a lot of pain she had swelling and pain with her foot     63 y.o. female presents with the above complaint.  Patient presents for follow-up of left ankle capsulitis/pain.  She states it has been bothersome has not gotten any better wanted get it evaluated hurts with ambulation worse with pressure she is doing little bit better today but yesterday she had a lateral pain.  She would like to get further imaging for possible   Review of Systems: Negative except as noted in the HPI. Denies N/V/F/Ch.  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    Current Outpatient Medications:    clotrimazole -betamethasone  (LOTRISONE ) cream, Apply 1 Application topically 2 (two) times daily., Disp: 30 g, Rfl: 0   fluticasone  (FLONASE ) 50 MCG/ACT nasal spray, Place 2 sprays into both nostrils daily as needed for allergies., Disp: , Rfl:    levocetirizine (XYZAL) 5 MG tablet, Take 5 mg by mouth at bedtime., Disp: , Rfl:    losartan  (COZAAR ) 100 MG tablet, Take 100 mg by mouth daily., Disp: , Rfl:    QUEtiapine  (SEROQUEL ) 50 MG tablet, Take 200 mg by mouth at bedtime., Disp: , Rfl:    rosuvastatin  (CRESTOR ) 10 MG tablet, Take 2 tablets (20 mg total) by mouth daily., Disp: 90 tablet, Rfl: 1   VITAMIN D, ERGOCALCIFEROL, PO, Take by mouth., Disp: , Rfl:   Social History   Tobacco Use  Smoking Status Never  Smokeless Tobacco Never    Allergies  Allergen Reactions   Penicillins Hives,  Anaphylaxis and Rash    Passing out, lumps and bumps, was given epi Other reaction(s): Hives/Urticaria Trouble Breathing  Other reaction(s): Hives/Urticaria Trouble Breathing  Trouble Breathing     Antihistamines, Diphenhydramine-Type     Pt does not want to take generic benedryl due to possible side effects   Objective:  There were no vitals filed for this visit. There is no height or weight on file to calculate BMI. Constitutional Well developed. Well nourished.  Vascular Dorsalis pedis pulses palpable bilaterally. Posterior tibial pulses palpable bilaterally. Capillary refill normal to all digits.  No cyanosis or clubbing noted. Pedal hair growth normal.  Neurologic Normal speech. Oriented to person, place, and time. Epicritic sensation to light touch grossly present bilaterally.  Dermatologic Nails well groomed and normal in appearance. No open wounds. No skin lesions.  Orthopedic: Pain on palpation bilateral lateral ankle.  Mild pain with range of motion pain at the ATFL ligament bilaterally.  No pain at the Achilles tendon peroneal tendon posterior tibial tendon   Radiographs:  3 views of skeletally mature adult left ankle: Assessment:   No diagnosis found.  Plan:  Patient was evaluated and treated and all questions answered.  Left ankle capsulitis/ATFL ligament tear - All questions and concerns were discussed with the patient  in extensive detail given the amount of pain that she is experiencing she would benefit from steroid injection to help with the inflammation of the pain that she is experiencing.  However patient would like to hold off on steroid injection for now.  If there is no improvement we will discuss steroid injection in the future - I discussed Voltaren  tablet which was sent to the pharmacy to help with some arthritic pain that she might be experiencing MRI was ordered awaiting MRI. -

## 2024-01-28 ENCOUNTER — Institutional Professional Consult (permissible substitution): Admitting: Plastic Surgery

## 2024-01-31 ENCOUNTER — Ambulatory Visit
Admission: RE | Admit: 2024-01-31 | Discharge: 2024-01-31 | Disposition: A | Discharge status: Home / Self Care | Source: Ambulatory Visit | Attending: Podiatry | Admitting: Podiatry

## 2024-01-31 DIAGNOSIS — T148XXA Other injury of unspecified body region, initial encounter: Secondary | ICD-10-CM

## 2024-02-11 ENCOUNTER — Other Ambulatory Visit: Payer: Self-pay | Admitting: "Endocrinology

## 2024-02-19 NOTE — Telephone Encounter (Signed)
 Requested Prescriptions   Pending Prescriptions Disp Refills   OZEMPIC , 0.25 OR 0.5 MG/DOSE, 2 MG/3ML SOPN [Pharmacy Med Name: Ozempic  (0.25 or 0.5 MG/DOSE) 2 MG/3ML Subcutaneous Solution Pen-injector] 3 mL 0    Sig: INJECT 0.25 MG INTO THE SKIN ONCE A WEEK

## 2024-03-23 ENCOUNTER — Ambulatory Visit: Attending: Surgery

## 2024-03-23 VITALS — Wt 227.4 lb

## 2024-03-23 DIAGNOSIS — Z483 Aftercare following surgery for neoplasm: Secondary | ICD-10-CM | POA: Insufficient documentation

## 2024-03-23 NOTE — Therapy (Signed)
 OUTPATIENT PHYSICAL THERAPY SOZO SCREENING NOTE   Patient Name: Allison Potts MRN: [id number] DOB:[birth year], 63 y.o., female Today's Date: 03/23/2024  PCP: Leontine Cramp, NP REFERRING PROVIDER: Vernetta Berg, MD   PT End of Session - 03/23/24 1006     Visit Number 2   # unchanged due to screen only   PT Start Time 1005    PT Stop Time 1010    PT Time Calculation (min) 5 min    Activity Tolerance Patient tolerated treatment well    Behavior During Therapy [facility] for tasks assessed/performed          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   Past Surgical History:  Procedure Laterality Date   APPENDECTOMY     BREAST BIOPSY Left 11/12/2023   US  LT BREAST BX W LOC DEV 1ST LESION IMG BX SPEC US  GUIDE 11/12/2023 [facility]   BREAST BIOPSY Left 12/10/2023   US  LT RADIOACTIVE SEED LOC 12/10/2023 [facility]MO[facility]   BREAST LUMPECTOMY WITH RADIOACTIVE SEED AND SENTINEL LYMPH NODE BIOPSY Left 12/12/2023   Procedure: BREAST LUMPECTOMY WITH RADIOACTIVE SEED AND SENTINEL LYMPH NODE BIOPSY;  Surgeon: Vernetta Berg, MD;  Location: [facility];  Service: General;  Laterality: Left;   THYROID  LOBECTOMY     TUBAL LIGATION     Patient Active Problem List   Diagnosis Date Noted   Genetic testing 12/02/2023   Malignant neoplasm of upper-outer quadrant of left breast in female, estrogen receptor positive (HCC) 11/18/2023   Chest pain 08/01/2021   Hypokalemia 08/01/2021   Refusal of blood transfusions as patient is Jehovah's Witness 05/31/2021   Atrial fibrillation (HCC) 05/29/2021   Prediabetes 05/29/2021   Primary  hypertension 03/21/2020   Anxiety and depression 12/19/2015   Fibromyalgia 02/07/2012   Obstructive sleep apnea (adult) (pediatric) 07/31/2011    REFERRING DIAG: left breast cancer at risk for lymphedema  THERAPY DIAG:  Aftercare following surgery for neoplasm  PERTINENT HISTORY: Patient was diagnosed on 07/11/2023 with left grade 2 invasive ductal carcinoma breast cancer. It measures 2.6 cm and is located in the upper outer quadrant. It is ER/PR positive and HER2 negative with a Ki67 of 5%. She has a history of thyroid  cancer with a partial thyroidectomy. She is s/p  Left Breast lumpectomy with deep left axillary SLNB on 12/12/2023 and 0+/5 LN's. She has not met with oncology yet re;radiation  PRECAUTIONS: left UE Lymphedema risk, None  SUBJECTIVE: Pt returns for her first 3 month L-Dex screen.   PAIN:  Are you having pain? No  SOZO SCREENING: Patient was assessed today using the SOZO machine to determine the lymphedema index score. This was compared to her baseline score. It was determined that she is within the recommended range when compared to her baseline and no further action is needed at this time. She will continue SOZO screenings. These are done every 3 months for 2 years post operatively followed by every 6 months for 2 years, and then annually.    L-DEX FLOWSHEETS - 03/23/24  1000       L-DEX LYMPHEDEMA SCREENING   Measurement Type Unilateral    L-DEX MEASUREMENT EXTREMITY Upper Extremity    POSITION  Standing    DOMINANT SIDE Right    At Risk Side Left    BASELINE SCORE (UNILATERAL) 1.9    L-DEX SCORE (UNILATERAL) 4.2    VALUE CHANGE (UNILAT) 2.3            Aden Berwyn Caldron, PTA 03/23/2024, 10:11 AM

## 2024-03-24 ENCOUNTER — Telehealth: Payer: Self-pay

## 2024-03-24 NOTE — Telephone Encounter (Signed)
 Patient would like results from MRI that was done on 01/31/2024. Please give patient a call or advise as to if patient needs to come in to discuss results.

## 2024-04-13 ENCOUNTER — Encounter: Payer: Self-pay | Admitting: Adult Health

## 2024-04-13 ENCOUNTER — Inpatient Hospital Stay: Attending: Hematology and Oncology | Admitting: Adult Health

## 2024-04-13 VITALS — BP 125/72 | HR 74 | Temp 97.8°F | Resp 17 | Ht 63.0 in | Wt 227.8 lb

## 2024-04-13 DIAGNOSIS — Z170 Estrogen receptor positive status [ER+]: Secondary | ICD-10-CM | POA: Diagnosis not present

## 2024-04-13 DIAGNOSIS — C50412 Malignant neoplasm of upper-outer quadrant of left female breast: Secondary | ICD-10-CM

## 2024-04-13 DIAGNOSIS — Z1732 Human epidermal growth factor receptor 2 negative status: Secondary | ICD-10-CM | POA: Diagnosis not present

## 2024-04-13 NOTE — Progress Notes (Signed)
 SURVIVORSHIP VISIT:  BRIEF ONCOLOGIC HISTORY:  Oncology History  Malignant neoplasm of upper-outer quadrant of left breast in female, estrogen receptor positive (HCC)  10/28/2023 Mammogram   Suspicious 2.6 cm LEFT breast mass (2 o'clock 4 CMFN). Recommend ultrasound-guided core needle biopsy. Indeterminate LEFT axillary lymph node with cortical thickness of 4 mm. Recommend ultrasound-guided core needle biopsy.   11/12/2023 Pathology Results   Left breast needle core biopsy at 2 :00 4cmfn showed grade 2 IDC, LN tissue benign,   The tumor cells are NEGATIVE for Her2 (0-1+). Estrogen Receptor:  100%, POSITIVE, STRONG STAINING INTENSITY Progesterone Receptor:  100%, POSITIVE, STRONG STAINING INTENSITY Proliferation Marker Ki67:  5%    11/18/2023 Initial Diagnosis   Malignant neoplasm of upper-outer quadrant of left breast in female, estrogen receptor positive (HCC)   11/20/2023 Cancer Staging   Staging form: Breast, AJCC 8th Edition - Clinical stage from 11/20/2023: Stage IB (cT2, cN0, cM0, G2, ER+, PR+, HER2-) - Signed by Loretha Ash, MD on 11/20/2023 Stage prefix: Initial diagnosis Histologic grading system: 3 grade system Laterality: Left Staged by: Pathologist and managing physician Stage used in treatment planning: Yes National guidelines used in treatment planning: Yes Type of national guideline used in treatment planning: NCCN    Genetic Testing   [facility] CancerNext-Expanded Panel+RNA was Negative. Report date is 11/30/2023.  The CancerNext-Expanded gene panel offered by [facility] and includes sequencing, rearrangement, and RNA analysis for the following 77 genes: AIP, ALK, APC, ATM, AXIN2, BAP1, BARD1, BMPR1A, BRCA1, BRCA2, BRIP1, CDC73, CDH1, CDK4, CDKN1B, CDKN2A, CEBPA, CHEK2, CTNNA1, DDX41, DICER1, ETV6, FH, FLCN, GATA2, LZTR1, MAX, MBD4, MEN1, MET, MLH1, MSH2, MSH3, MSH6, MUTYH, NF1, NF2, NTHL1, PALB2, PHOX2B, PMS2, POT1, PRKAR1A, PTCH1, PTEN, RAD51C, RAD51D, RB1, RET, RPS20,  RUNX1, SDHA, SDHAF2, SDHB, SDHC, SDHD, SMAD4, SMARCA4, SMARCB1, SMARCE1, STK11, SUFU, TMEM127, TP53, TSC1, TSC2, VHL, and WT1 (sequencing and deletion/duplication); EGFR, HOXB13, KIT, MITF, PDGFRA, POLD1, and POLE (sequencing only); EPCAM and GREM1 (deletion/duplication only).     12/27/2023 Oncotype testing   Recurrence score: 5.  3% risk of cancer recurrence outside of the breast within 9 years using antiestrogen therapy alone.       INTERVAL HISTORY:  Discussed the use of AI scribe software for clinical note transcription with the patient, who gave verbal consent to proceed.  History of Present Illness Allison Potts is a 63 year old female with stage 1A ER/PR-positive left-sided invasive breast cancer, status post lumpectomy, who presents for a survivorship visit.  She has occasional mild twinges in the left breast without new or concerning breast changes.  Genetic testing for breast cancer-associated genes was negative. Her Oncotype DX score was 5, so she did not receive chemotherapy and she is not on anti-endocrine therapy.  She is undergoing SOZO monitoring for lymphedema surveillance. She has not had bone density testing. She has not had a hysterectomy, and prior gynecologic care indicated no further routine GYN visits are needed based on age and normal results.  REVIEW OF SYSTEMS:  Review of Systems  Constitutional:  Negative for appetite change, chills, fatigue, fever and unexpected weight change.  HENT:   Negative for hearing loss, lump/mass and trouble swallowing.   Eyes:  Negative for eye problems and icterus.  Respiratory:  Negative for chest tightness, cough and shortness of breath.   Cardiovascular:  Negative for chest pain, leg swelling and palpitations.  Gastrointestinal:  Negative for abdominal distention, abdominal pain, constipation, diarrhea, nausea and vomiting.  Endocrine: Negative for hot flashes.  Genitourinary:  Negative for difficulty  urinating.    Musculoskeletal:  Negative for arthralgias.  Skin:  Negative for itching and rash.  Neurological:  Negative for dizziness, extremity weakness, headaches and numbness.  Hematological:  Negative for adenopathy. Does not bruise/bleed easily.  Psychiatric/Behavioral:  Negative for depression. The patient is not nervous/anxious.    Breast: Denies any new nodularity, masses, tenderness, nipple changes, or nipple discharge.       PAST MEDICAL/SURGICAL HISTORY:  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   Past Surgical History:  Procedure Laterality Date   APPENDECTOMY     BREAST BIOPSY Left 11/12/2023   US  LT BREAST BX W LOC DEV 1ST LESION IMG BX SPEC US  GUIDE 11/12/2023 [facility]MO[facility]   BREAST BIOPSY Left 12/10/2023   US  LT RADIOACTIVE SEED LOC 12/10/2023 [facility]AMMO[facility]   BREAST LUMPECTOMY WITH RADIOACTIVE SEED AND SENTINEL LYMPH NODE BIOPSY Left 12/12/2023   Procedure: BREAST LUMPECTOMY WITH RADIOACTIVE SEED AND SENTINEL LYMPH NODE BIOPSY;  Surgeon: Vernetta Berg, MD;  Location: [facility];  Service: General;  Laterality: Left;   THYROID  LOBECTOMY     TUBAL LIGATION       ALLERGIES:  Allergies[1]   CURRENT MEDICATIONS:  Outpatient Encounter Medications as of 04/13/2024  Medication Sig   amitriptyline (ELAVIL) 50 MG tablet Take 50 mg by mouth at bedtime.   clotrimazole -betamethasone  (LOTRISONE ) cream Apply 1 Application topically 2 (two) times daily.   DENTA 5000 PLUS 1.1 % CREA dental cream Place 1 Application onto teeth at bedtime.   diclofenac  Sodium (VOLTAREN ) 1 % GEL Apply 2 g topically 4 (four) times daily.    fluticasone  (FLONASE ) 50 MCG/ACT nasal spray Place 2 sprays into both nostrils daily as needed for allergies.   levocetirizine (XYZAL) 5 MG tablet Take 5 mg by mouth at bedtime.   losartan  (COZAAR ) 100 MG tablet Take 100 mg by mouth daily.   MOUNJARO 5 MG/0.5ML Pen Inject 5 mg into the skin once a week.   rosuvastatin  (CRESTOR ) 10 MG tablet Take 2 tablets (20 mg total) by mouth daily.   tamsulosin (FLOMAX) 0.4 MG CAPS capsule Take 0.4 mg by mouth daily after supper.   tiZANidine (ZANAFLEX) 4 MG tablet Take 4 mg by mouth 2 (two) times daily as needed.   VITAMIN D, ERGOCALCIFEROL, PO Take by mouth.   QUEtiapine  (SEROQUEL ) 50 MG tablet Take 200 mg by mouth at bedtime. (Patient not taking: Reported on 04/13/2024)   [DISCONTINUED] OZEMPIC , 0.25 OR 0.5 MG/DOSE, 2 MG/3ML SOPN INJECT 0.25 MG INTO THE SKIN ONCE A WEEK   No facility-administered encounter medications on file as of 04/13/2024.     ONCOLOGIC FAMILY HISTORY:  Family History  Problem Relation Age of Onset   Diabetes Mother    Heart disease Mother    Emphysema Mother    Heart disease Father    Diabetes Father    Breast cancer Sister 18   Lung cancer Brother 34       smoked, maternal half-brother   Breast cancer Maternal Aunt  bilateral   Breast cancer Paternal Aunt    Breast cancer Niece 15 - 15   Colon cancer Maternal Cousin 23 - 82     SOCIAL HISTORY:  Social History   Socioeconomic History   Marital status: Married    Spouse name: Not on file   Number of children: Not on file   Years of education: Not on file   Highest education level: Not on file  Occupational History   Not on file  Tobacco Use   Smoking status: Never   Smokeless tobacco: Never  Vaping Use   Vaping status: Never Used  Substance and Sexual Activity   Alcohol use: Not Currently   Drug use: Never   Sexual activity: Yes    Birth control/protection: Surgical, Post-menopausal    Comment: BTL  Other Topics Concern   Not on file   Social History Narrative   Not on file   Social Drivers of Health   Tobacco Use: Medium Risk (03/27/2024)   Received from [facility]   Patient History    Smoking Tobacco Use: Former    Smokeless Tobacco Use: Never    Passive Exposure: Never  Physicist, Medical Strain: Low Risk  (05/25/2021)   Received from [facility] visits prior to 06/30/2022.   Overall Financial Resource Strain (CARDIA)    Difficulty of Paying Living Expenses: Not very hard  Food Insecurity: No Food Insecurity (11/20/2023)   Epic    Worried About Programme Researcher, Broadcasting/film/video in the Last Year: Never true    Ran Out of Food in the Last Year: Never true  Transportation Needs: No Transportation Needs (08/08/2023)   PRAPARE - Administrator, Civil Service (Medical): No    Lack of Transportation (Non-Medical): No  Physical Activity: Insufficiently Active (05/25/2021)   Received from [facility] visits prior to 06/30/2022.   Exercise Vital Sign    On average, how many days per week do you engage in moderate to strenuous exercise (like a brisk walk)?: 5 days    On average, how many minutes do you engage in exercise at this level?: 20 min  Stress: Stress Concern Present (05/25/2021)   Received from [facility] visits prior to 06/30/2022.   [employer] of Occupational Health - Occupational Stress Questionnaire    Feeling of Stress : Very much  Social Connections: Unknown (11/30/2022)   Received from [facility]   Social Network    Social Network: Not on file  Intimate Partner Violence: Not At Risk (11/20/2023)   Epic    Fear of Current or Ex-Partner: No    Emotionally Abused: No    Physically Abused: No    Sexually Abused: No  Depression (PHQ2-9): Low Risk (01/07/2024)   Depression (PHQ2-9)    PHQ-2 Score: 0  Alcohol Screen: Not on file  Housing: Low Risk (11/20/2023)   Epic    Unable to Pay for Housing in the Last Year: No    Number of Times  Moved in the Last Year: 0    Homeless in the Last Year: No  Utilities: Not At Risk (11/20/2023)   Epic    Threatened with loss of utilities: No  Health Literacy: Not on file     OBSERVATIONS/OBJECTIVE:  BP 125/72 (BP Location: Left Arm, Patient Position: Sitting)   Pulse 74   Temp 97.8 F (36.6 C) (Oral)   Resp 17   Ht 5' 3 (1.6 m)  Wt 227 lb 12.8 oz (103.3 kg)   SpO2 98%   BMI 40.35 kg/m  GENERAL: Patient is a well appearing female in no acute distress HEENT:  Sclerae anicteric.  Oropharynx clear and moist. No ulcerations or evidence of oropharyngeal candidiasis. Neck is supple.  NODES:  No cervical, supraclavicular, or axillary lymphadenopathy palpated.  BREAST EXAM:  left breast s/p lumpectomy; no sign of local recurrence, right breast benign LUNGS:  Clear to auscultation bilaterally.  No wheezes or rhonchi. HEART:  Regular rate and rhythm. No murmur appreciated. ABDOMEN:  Soft, nontender.  Positive, normoactive bowel sounds. No organomegaly palpated. MSK:  No focal spinal tenderness to palpation. Full range of motion bilaterally in the upper extremities. EXTREMITIES:  No peripheral edema.   SKIN:  Clear with no obvious rashes or skin changes. No nail dyscrasia. NEURO:  Nonfocal. Well oriented.  Appropriate affect.   LABORATORY DATA:  None for this visit.  DIAGNOSTIC IMAGING:  None for this visit.   ASSESSMENT AND PLAN:  Ms.. Allison Potts is a pleasant 63 y.o. female with Stage IA left breast invasive ductal carcinoma, ER+/PR+/HER2-, diagnosed in 10/2023, treated with lumpectomy (opted to forego radiation and antiestrogen therapy).  She presents to the Survivorship Clinic for our initial meeting and routine follow-up post-completion of treatment for breast cancer.    1. Stage IA left breast cancer:  Ms. Allison Potts is continuing to recover from definitive treatment for breast cancer. She will follow-up with her medical oncologist, Dr.  Loretha in 12/2024 with history and physical  exam per surveillance protocol.  Her mammogram is due 09/2024; orders placed today.  We discussed Guardant Reveal blood testing.  She will read more about this, and will let me know if she decides to pursue testing.  If so, we are happy to order it for mobile, or to be drawn at our lab. Today, a comprehensive survivorship care plan and treatment summary was reviewed with the patient today detailing her breast cancer diagnosis, treatment course, potential late/long-term effects of treatment, appropriate follow-up care with recommendations for the future, and patient education resources.  A copy of this summary, along with a letter will be sent to the patients primary care provider via mail/fax/In Basket message after todays visit.    2. Bone health:    She was given education on specific activities to promote bone health.  3. Cancer screening:  Due to Ms. Allison Potts's history and her age, she should receive screening for skin cancers, colon cancer, and gynecologic cancers.  The information and recommendations are listed on the patient's comprehensive care plan/treatment summary and were reviewed in detail with the patient.    4. Health maintenance and wellness promotion: Ms. Allison Potts was encouraged to consume 5-7 servings of fruits and vegetables per day. We reviewed the Nutrition Rainbow handout.  She was also encouraged to engage in moderate to vigorous exercise for 30 minutes per day most days of the week.  She was instructed to limit her alcohol consumption and continue to abstain from tobacco use.      5. Support services/counseling: It is not uncommon for this period of the patient's cancer care trajectory to be one of many emotions and stressors.   She was given information regarding our available services and encouraged to contact me with any questions or for help enrolling in any of our support group/programs.    Follow up instructions:    -Return to cancer center in 12/2024 for f/u with Dr. Loretha   -Mammogram due in 09/2024 -  Follow up with Dr. Vernetta at [facility] 06/2024 -She is welcome to return back to the Survivorship Clinic at any time; no additional follow-up needed at this time.  -Consider referral back to survivorship as a long-term survivor for continued surveillance  The patient was provided an opportunity to ask questions and all were answered. The patient agreed with the plan and demonstrated an understanding of the instructions.   Total encounter time:40 minutes*in face-to-face visit time, chart review, lab review, care coordination, order entry, and documentation of the encounter time.    Morna Kendall, NP 04/13/2024 9:38 AM Medical Oncology and Hematology [facility] [address] [place], [place] [address] Tel. [phone]    Fax. [phone]  *Total Encounter Time as defined by the Centers for Medicare and Medicaid Services includes, in addition to the face-to-face time of a patient visit (documented in the note above) non-face-to-face time: obtaining and reviewing outside history, ordering and reviewing medications, tests or procedures, care coordination (communications with other health care professionals or caregivers) and documentation in the medical record.     [1]  Allergies Allergen Reactions   Penicillins Hives, Anaphylaxis and Rash    Passing out, lumps and bumps, was given epi Other reaction(s): Hives/Urticaria Trouble Breathing  Other reaction(s): Hives/Urticaria Trouble Breathing  Trouble Breathing     Antihistamines, Diphenhydramine-Type     Pt does not want to take generic benedryl due to possible side effects

## 2024-04-21 ENCOUNTER — Encounter: Payer: Self-pay | Admitting: "Endocrinology

## 2024-04-21 ENCOUNTER — Other Ambulatory Visit

## 2024-04-21 ENCOUNTER — Ambulatory Visit: Admitting: "Endocrinology

## 2024-04-21 VITALS — BP 140/80 | HR 64 | Ht 63.0 in | Wt 225.0 lb

## 2024-04-21 DIAGNOSIS — R7303 Prediabetes: Secondary | ICD-10-CM

## 2024-04-21 DIAGNOSIS — E782 Mixed hyperlipidemia: Secondary | ICD-10-CM

## 2024-04-21 LAB — POCT GLYCOSYLATED HEMOGLOBIN (HGB A1C): Hemoglobin A1C: 6.2 % — AB (ref 4.0–5.6)

## 2024-04-21 MED ORDER — TIRZEPATIDE 7.5 MG/0.5ML ~~LOC~~ SOAJ: 7.5000 mg | SUBCUTANEOUS | 0 refills | Status: AC

## 2024-04-21 NOTE — Telephone Encounter (Signed)
 Called pt and informed her that she will need to f/u with PCP regarding concern.

## 2024-04-21 NOTE — Progress Notes (Signed)
 "   Outpatient Endocrinology Note Obadiah Birmingham, MD  04/21/2024   Allison Potts 01-23-1961 [id number]  Referring Provider: Leontine Cramp, NP Primary Care Provider: Leontine Cramp, NP Reason for consultation: Subjective   Assessment & Plan  Diagnoses and all orders for this visit:  Pre-diabetes -     POCT glycosylated hemoglobin (Hb A1C)  Mixed hypercholesterolemia and hypertriglyceridemia -     Lipid panel  Other orders -     tirzepatide  (MOUNJARO ) 7.5 MG/0.5ML Pen; Inject 7.5 mg into the skin once a week.   Pre-diabetes, No results found for: GFR Hba1c goal less than 7, current Hba1c is  Lab Results  Component Value Date   HGBA1C 6.2 (A) 04/21/2024   Will recommend the following: Mounjaro  5 mg weekly   Reports got sick on ozempic  and was shifted to Mounajro by PCP   Previously tolerated ozempic  1 mg without S/E   No known contraindications/side effects to any of above medications No history of MEN syndrome/medullary thyroid  cancer/pancreatitis or pancreatic cancer in self or family   -Last LD and Tg are as follows: Lab Results  Component Value Date   LDLCALC 118 (H) 07/16/2023    Lab Results  Component Value Date   TRIG 380 (H) 07/16/2023   -not on statin -resume rosuvastatin  20 mg every day   -Follow low fat diet and exercise   -Blood pressure goal <140/90 - Microalbumin/creatinine goal is < 30 -Last MA/Cr is as follows: Lab Results  Component Value Date   MICROALBUR 1.2 07/16/2023   -back on  losartan  - on ACE/ARB  -diet changes including salt restriction -limit eating outside -counseled BP targets per standards of diabetes care -uncontrolled blood pressure can lead to retinopathy, nephropathy and cardiovascular and atherosclerotic heart disease  Reviewed and counseled on: -A1C target -Blood sugar targets -Complications of uncontrolled diabetes  -Checking blood sugar before meals and bedtime and bring log next visit -All medications with  mechanism of action and side effects -Hypoglycemia management: rule of 15's, Glucagon Emergency Kit and medical alert ID -low-carb low-fat plate-method diet -At least 20 minutes of physical activity per day -Annual dilated retinal eye exam and foot exam -compliance and follow up needs -follow up as scheduled or earlier if problem gets worse  Call if blood sugar is less than 70 or consistently above 250    Take a 15 gm snack of carbohydrate at bedtime before you go to sleep if your blood sugar is less than 100.    If you are going to fast after midnight for a test or procedure, ask your physician for instructions on how to reduce/decrease your insulin dose.    Call if blood sugar is less than 70 or consistently above 250  -Treating a low sugar by rule of 15  (15 gms of sugar every 15 min until sugar is more than 70) If you feel your sugar is low, test your sugar to be sure If your sugar is low (less than 70), then take 15 grams of a fast acting Carbohydrate (3-4 glucose tablets or glucose gel or 4 ounces of juice or regular soda) Recheck your sugar 15 min after treating low to make sure it is more than 70 If sugar is still less than 70, treat again with 15 grams of carbohydrate          Don't drive the hour of hypoglycemia  If unconscious/unable to eat or drink by mouth, use glucagon injection or nasal spray baqsimi and call 911. Can  repeat again in 15 min if still unconscious.  Return in about 2 months (around 06/22/2024) for visit, labs today.   I have reviewed current medications, nurse's notes, allergies, vital signs, past medical and surgical history, family medical history, and social history for this encounter. Counseled patient on symptoms, examination findings, lab findings, imaging results, treatment decisions and monitoring and prognosis. The patient understood the recommendations and agrees with the treatment plan. All questions regarding treatment plan were fully  answered.  Obadiah Birmingham, MD  04/21/2024    History of Present Illness Allison Potts is a 63 y.o. year old female who presents for follow up of pre-diabetes.  Allison Potts was first diagnosed in 2023.   Diabetes education +  Stopped ozempic  and farxiga to focus on lifestyle   Home diabetes regimen: Mounjaro  5 mg weekly  COMPLICATIONS -  MI/Stroke -  retinopathy + neuropathy (carpal tunnel) -  nephropathy  BLOOD SUGAR DATA Checks sugar at home 2 hr after meal BG 91-107 per log  Physical Exam  BP (!) 140/80   Pulse 64   Ht 5' 3 (1.6 m)   Wt 225 lb (102.1 kg)   SpO2 98%   BMI 39.86 kg/m    Constitutional: well developed, well nourished Head: normocephalic, atraumatic Eyes: sclera anicteric, no redness Neck: supple Lungs: normal respiratory effort Neurology: alert and oriented Skin: dry, no appreciable rashes Musculoskeletal: no appreciable defects Psychiatric: normal mood and affect Diabetic Foot Exam - Simple   No data filed      Current Medications Patient's Medications  New Prescriptions   TIRZEPATIDE  (MOUNJARO ) 7.5 MG/0.5ML PEN    Inject 7.5 mg into the skin once a week.  Previous Medications   AMITRIPTYLINE (ELAVIL) 50 MG TABLET    Take 50 mg by mouth at bedtime.   CLOTRIMAZOLE -BETAMETHASONE  (LOTRISONE ) CREAM    Apply 1 Application topically 2 (two) times daily.   DENTA 5000 PLUS 1.1 % CREA DENTAL CREAM    Place 1 Application onto teeth at bedtime.   DICLOFENAC  SODIUM (VOLTAREN ) 1 % GEL    Apply 2 g topically 4 (four) times daily.   FLUTICASONE  (FLONASE ) 50 MCG/ACT NASAL SPRAY    Place 2 sprays into both nostrils daily as needed for allergies.   LEVOCETIRIZINE (XYZAL) 5 MG TABLET    Take 5 mg by mouth at bedtime.   LOSARTAN  (COZAAR ) 100 MG TABLET    Take 100 mg by mouth daily.   ROSUVASTATIN  (CRESTOR ) 10 MG TABLET    Take 2 tablets (20 mg total) by mouth daily.   TAMSULOSIN (FLOMAX) 0.4 MG CAPS CAPSULE    Take 0.4 mg by mouth daily after supper.    TIZANIDINE (ZANAFLEX) 4 MG TABLET    Take 4 mg by mouth 2 (two) times daily as needed.   VITAMIN D, ERGOCALCIFEROL, PO    Take by mouth.  Modified Medications   No medications on file  Discontinued Medications   MOUNJARO  5 MG/0.5ML PEN    Inject 5 mg into the skin once a week.    Allergies Allergies  Allergen Reactions   Penicillins Hives, Anaphylaxis and Rash    Passing out, lumps and bumps, was given epi Other reaction(s): Hives/Urticaria Trouble Breathing  Other reaction(s): Hives/Urticaria Trouble Breathing  Trouble Breathing     Antihistamines, Diphenhydramine-Type     Pt does not want to take generic benedryl due to possible side effects    Past Medical History Past Medical History:  Diagnosis Date   Anxiety  Arthritis    Asthma    Cancer (HCC) 10/2023   Left breast IDC with DCIS   Depression    Diabetes mellitus without complication (HCC)    Fibromyalgia    Hypertension    Mental disorder    PID (acute pelvic inflammatory disease)    her 20s   Sleep apnea    uses CPAP nightly   Thyroid  disease    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    Past Surgical History Past Surgical History:  Procedure Laterality Date   APPENDECTOMY     BREAST BIOPSY Left 11/12/2023   US  LT BREAST BX W LOC DEV 1ST LESION IMG BX SPEC US  GUIDE 11/12/2023 [facility]   BREAST BIOPSY Left 12/10/2023   US  LT RADIOACTIVE SEED LOC 12/10/2023 [facility]   BREAST LUMPECTOMY WITH RADIOACTIVE SEED AND SENTINEL LYMPH NODE BIOPSY Left 12/12/2023   Procedure: BREAST LUMPECTOMY WITH RADIOACTIVE SEED AND SENTINEL LYMPH NODE BIOPSY;  Surgeon: Vernetta Berg, MD;  Location: [facility];  Service: General;  Laterality: Left;   THYROID  LOBECTOMY     TUBAL LIGATION      Family History family history includes Breast cancer in her maternal aunt and paternal aunt; Breast cancer (age of onset: 37 37 39) in her niece; Breast cancer (age of onset: 7) in her sister; Colon cancer  (age of onset: 93 - 58) in her maternal cousin; Diabetes in her father and mother; Emphysema in her mother; Heart disease in her father and mother; Lung cancer (age of onset: 57) in her brother.  Social History Social History   Socioeconomic History   Marital status: Married    Spouse name: Not on file   Number of children: Not on file   Years of education: Not on file   Highest education level: Not on file  Occupational History   Not on file  Tobacco Use   Smoking status: Never   Smokeless tobacco: Never  Vaping Use   Vaping status: Never Used  Substance and Sexual Activity   Alcohol use: Not Currently   Drug use: Never   Sexual activity: Yes    Birth control/protection: Surgical, Post-menopausal    Comment: BTL  Other Topics Concern   Not on file  Social History Narrative   Not on file   Social Drivers of Health   Tobacco Use: Low Risk (04/21/2024)   Patient History    Smoking Tobacco Use: Never    Smokeless Tobacco Use: Never    Passive Exposure: Not on file  Recent Concern: Tobacco Use - Medium Risk (03/27/2024)   Received from [facility]   Patient History    Smoking Tobacco Use: Former    Smokeless Tobacco Use: Never    Passive Exposure: Never  Physicist, Medical Strain: Low Risk (04/13/2024)   Overall Financial Resource Strain (CARDIA)    Difficulty of Paying Living Expenses: Not hard at all  Food Insecurity: No Food Insecurity (04/13/2024)   Epic    Worried About Radiation Protection Practitioner of Food in the Last Year: Never true    Ran Out of Food in the Last Year: Never true  Transportation Needs: No Transportation Needs (04/13/2024)   Epic    Lack of Transportation (Medical): No    Lack of Transportation (Non-Medical): No  Physical Activity: Insufficiently Active (04/13/2024)   Exercise Vital Sign    Days of Exercise per Week: 5 days    Minutes of Exercise per Session: 20 min  Stress: No Stress Concern Present (  04/13/2024)   [employer] of Occupational Health  - Occupational Stress Questionnaire    Feeling of Stress: Not at all  Social Connections: Socially Integrated (04/13/2024)   Social Connection and Isolation Panel    Frequency of Communication with Friends and Family: More than three times a week    Frequency of Social Gatherings with Friends and Family: More than three times a week    Attends Religious Services: More than 4 times per year    Active Member of Clubs or Organizations: Yes    Attends Banker Meetings: More than 4 times per year    Marital Status: Married  Catering Manager Violence: Not At Risk (04/13/2024)   Epic    Fear of Current or Ex-Partner: No    Emotionally Abused: No    Physically Abused: No    Sexually Abused: No  Depression (PHQ2-9): Low Risk (04/13/2024)   Depression (PHQ2-9)    PHQ-2 Score: 0  Alcohol Screen: Low Risk (04/13/2024)   Alcohol Screen    Last Alcohol Screening Score (AUDIT): 0  Housing: Unknown (04/13/2024)   Epic    Unable to Pay for Housing in the Last Year: No    Number of Times Moved in the Last Year: Not on file    Homeless in the Last Year: No  Utilities: Not At Risk (04/13/2024)   Epic    Threatened with loss of utilities: No  Health Literacy: Adequate Health Literacy (04/13/2024)   B1300 Health Literacy    Frequency of need for help with medical instructions: Never    Lab Results  Component Value Date   HGBA1C 6.2 (A) 04/21/2024   HGBA1C 6.1 (H) 07/16/2023   HGBA1C 5.7 (H) 08/02/2021   Lab Results  Component Value Date   CHOL 221 (H) 07/16/2023   Lab Results  Component Value Date   HDL 49 (L) 07/16/2023   Lab Results  Component Value Date   LDLCALC 118 (H) 07/16/2023   Lab Results  Component Value Date   TRIG 380 (H) 07/16/2023   Lab Results  Component Value Date   CHOLHDL 4.5 07/16/2023   Lab Results  Component Value Date   CREATININE 0.56 11/20/2023   No results found for: GFR Lab Results  Component Value Date   MICROALBUR 1.2  07/16/2023      Component Value Date/Time   NA 139 11/20/2023 1251   K 3.9 11/20/2023 1251   CL 105 11/20/2023 1251   CO2 30 11/20/2023 1251   GLUCOSE 150 (H) 11/20/2023 1251   BUN 10 11/20/2023 1251   CREATININE 0.56 11/20/2023 1251   CREATININE 0.68 07/16/2023 0942   CALCIUM  8.7 (L) 11/20/2023 1251   PROT 6.8 11/20/2023 1251   ALBUMIN 4.0 11/20/2023 1251   AST 49 (H) 11/20/2023 1251   ALT 44 11/20/2023 1251   ALKPHOS 90 11/20/2023 1251   BILITOT 0.6 11/20/2023 1251   GFRNONAA >60 11/20/2023 1251      Latest Ref Rng & Units 11/20/2023   12:51 PM 07/16/2023    9:42 AM 11/23/2022    2:30 AM  BMP  Glucose 70 - 99 mg/dL 849  87  842   BUN 8 - 23 mg/dL 10  17  18    Creatinine 0.44 - 1.00 mg/dL 9.43  9.31  9.09   BUN/Creat Ratio 6 - 22 (calc)  SEE NOTE:    Sodium 135 - 145 mmol/L 139  140  139   Potassium 3.5 - 5.1 mmol/L 3.9  4.3  4.0   Chloride 98 - 111 mmol/L 105  103  100   CO2 22 - 32 mmol/L 30  28    Calcium  8.9 - 10.3 mg/dL 8.7  9.2         Component Value Date/Time   WBC 7.1 11/20/2023 1251   WBC 7.2 08/02/2021 0224   RBC 4.42 11/20/2023 1251   HGB 13.4 11/20/2023 1251   HCT 39.3 11/20/2023 1251   PLT 250 11/20/2023 1251   MCV 88.9 11/20/2023 1251   MCH 30.3 11/20/2023 1251   MCHC 34.1 11/20/2023 1251   RDW 13.3 11/20/2023 1251   LYMPHSABS 2.9 11/20/2023 1251   MONOABS 0.5 11/20/2023 1251   EOSABS 0.2 11/20/2023 1251   BASOSABS 0.1 11/20/2023 1251     Parts of this note may have been dictated using voice recognition software. There may be variances in spelling and vocabulary which are unintentional. Not all errors are proofread. Please notify the dino if any discrepancies are noted or if the meaning of any statement is not clear.   "

## 2024-04-21 NOTE — Telephone Encounter (Signed)
 Pt stopped by my desk as she was leaving regarding a question. Pt stated that her hands and toes and always cold. Pt stated that she taking Vit b, please advise.

## 2024-04-22 LAB — LIPID PANEL
Cholesterol: 138 mg/dL
HDL: 57 mg/dL
LDL Cholesterol (Calc): 62 mg/dL
Non-HDL Cholesterol (Calc): 81 mg/dL
Total CHOL/HDL Ratio: 2.4 (calc)
Triglycerides: 100 mg/dL

## 2024-06-22 ENCOUNTER — Ambulatory Visit: Admitting: "Endocrinology

## 2024-06-22 ENCOUNTER — Ambulatory Visit: Attending: Surgery

## 2025-01-06 ENCOUNTER — Ambulatory Visit: Admitting: Hematology and Oncology
